# Patient Record
Sex: Female | Born: 1965 | Race: White | Hispanic: No | Marital: Married | Smoking: Never smoker
Health system: Southern US, Community
[De-identification: ages and names within clinical notes are randomized; demographics above are authoritative.]

## PROBLEM LIST (undated history)

## (undated) DIAGNOSIS — M810 Age-related osteoporosis without current pathological fracture: Secondary | ICD-10-CM

## (undated) DIAGNOSIS — J449 Chronic obstructive pulmonary disease, unspecified: Secondary | ICD-10-CM

## (undated) DIAGNOSIS — K219 Gastro-esophageal reflux disease without esophagitis: Secondary | ICD-10-CM

## (undated) DIAGNOSIS — E559 Vitamin D deficiency, unspecified: Secondary | ICD-10-CM

## (undated) DIAGNOSIS — E785 Hyperlipidemia, unspecified: Secondary | ICD-10-CM

## (undated) DIAGNOSIS — M549 Dorsalgia, unspecified: Secondary | ICD-10-CM

## (undated) DIAGNOSIS — F32A Depression, unspecified: Secondary | ICD-10-CM

## (undated) DIAGNOSIS — R011 Cardiac murmur, unspecified: Secondary | ICD-10-CM

## (undated) DIAGNOSIS — E039 Hypothyroidism, unspecified: Secondary | ICD-10-CM

## (undated) DIAGNOSIS — R413 Other amnesia: Secondary | ICD-10-CM

## (undated) DIAGNOSIS — S3992XA Unspecified injury of lower back, initial encounter: Secondary | ICD-10-CM

## (undated) DIAGNOSIS — G5600 Carpal tunnel syndrome, unspecified upper limb: Secondary | ICD-10-CM

## (undated) DIAGNOSIS — H9190 Unspecified hearing loss, unspecified ear: Secondary | ICD-10-CM

## (undated) DIAGNOSIS — I10 Essential (primary) hypertension: Secondary | ICD-10-CM

## (undated) DIAGNOSIS — I509 Heart failure, unspecified: Secondary | ICD-10-CM

## (undated) DIAGNOSIS — J9601 Acute respiratory failure with hypoxia: Secondary | ICD-10-CM

## (undated) DIAGNOSIS — O24429 Gestational diabetes mellitus in childbirth, unspecified control: Secondary | ICD-10-CM

## (undated) DIAGNOSIS — E669 Obesity, unspecified: Secondary | ICD-10-CM

## (undated) DIAGNOSIS — G43009 Migraine without aura, not intractable, without status migrainosus: Secondary | ICD-10-CM

## (undated) DIAGNOSIS — F329 Major depressive disorder, single episode, unspecified: Secondary | ICD-10-CM

## (undated) DIAGNOSIS — G4730 Sleep apnea, unspecified: Secondary | ICD-10-CM

## (undated) HISTORY — DX: Gestational diabetes mellitus in childbirth, unspecified control: O24.429

## (undated) HISTORY — DX: Other amnesia: R41.3

## (undated) HISTORY — DX: Hypothyroidism, unspecified: E03.9

## (undated) HISTORY — DX: Dorsalgia, unspecified: M54.9

## (undated) HISTORY — DX: Migraine without aura, not intractable, without status migrainosus: G43.009

## (undated) HISTORY — DX: Age-related osteoporosis without current pathological fracture: M81.0

## (undated) HISTORY — DX: Acute respiratory failure with hypoxia: J96.01

## (undated) HISTORY — DX: Vitamin D deficiency, unspecified: E55.9

## (undated) HISTORY — DX: Chronic obstructive pulmonary disease, unspecified: J44.9

## (undated) HISTORY — DX: Gastro-esophageal reflux disease without esophagitis: K21.9

## (undated) HISTORY — DX: Heart failure, unspecified: I50.9

## (undated) HISTORY — DX: Sleep apnea, unspecified: G47.30

## (undated) HISTORY — DX: Unspecified hearing loss, unspecified ear: H91.90

## (undated) HISTORY — PX: APPENDECTOMY: SHX54

## (undated) HISTORY — DX: Hyperlipidemia, unspecified: E78.5

## (undated) HISTORY — DX: Cardiac murmur, unspecified: R01.1

## (undated) HISTORY — DX: Obesity, unspecified: E66.9

## (undated) HISTORY — DX: Carpal tunnel syndrome, unspecified upper limb: G56.00

---
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
## 2004-07-19 ENCOUNTER — Ambulatory Visit: Payer: Self-pay | Admitting: Ophthalmology

## 2004-08-31 ENCOUNTER — Observation Stay: Payer: Self-pay

## 2004-08-31 ENCOUNTER — Other Ambulatory Visit: Payer: Self-pay

## 2004-09-05 ENCOUNTER — Ambulatory Visit: Payer: Self-pay | Admitting: Internal Medicine

## 2005-01-15 ENCOUNTER — Ambulatory Visit: Payer: Self-pay | Admitting: Family Medicine

## 2005-03-27 ENCOUNTER — Ambulatory Visit: Payer: Self-pay

## 2005-06-19 ENCOUNTER — Ambulatory Visit: Payer: Self-pay | Admitting: Nurse Practitioner

## 2005-07-01 ENCOUNTER — Emergency Department: Payer: Self-pay | Admitting: Emergency Medicine

## 2005-08-21 ENCOUNTER — Ambulatory Visit: Payer: Self-pay | Admitting: Gastroenterology

## 2005-11-25 ENCOUNTER — Ambulatory Visit: Payer: Self-pay | Admitting: Ophthalmology

## 2005-12-26 ENCOUNTER — Ambulatory Visit (HOSPITAL_COMMUNITY): Admission: RE | Admit: 2005-12-26 | Discharge: 2005-12-26 | Payer: Self-pay | Admitting: *Deleted

## 2007-01-23 IMAGING — CT CT ABD-PELV W/O CM
1 of 2 series · 16 of 32 positions shown, 20 images · non-contrast
Comparison: none

REASON FOR EXAM: Abdominal pain.  [facility] protocol. Rm 1
COMMENTS:

[Series 2: soft tissue · axial · 0.73mm/px · z∈[+461,+860]mm · 16 of 145 slices shown, 20 images]
[im 6/145  soft-tissue]
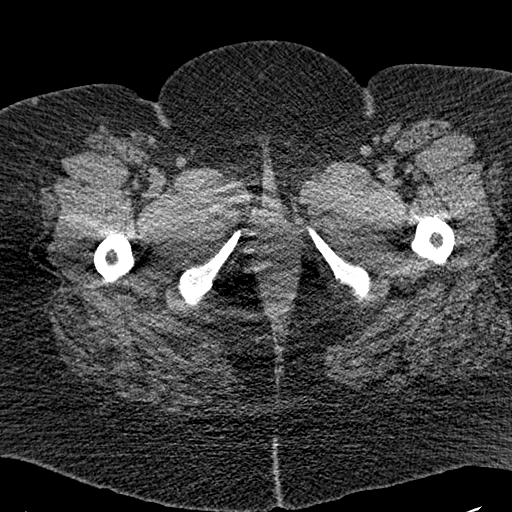
[im 6/145  bone]
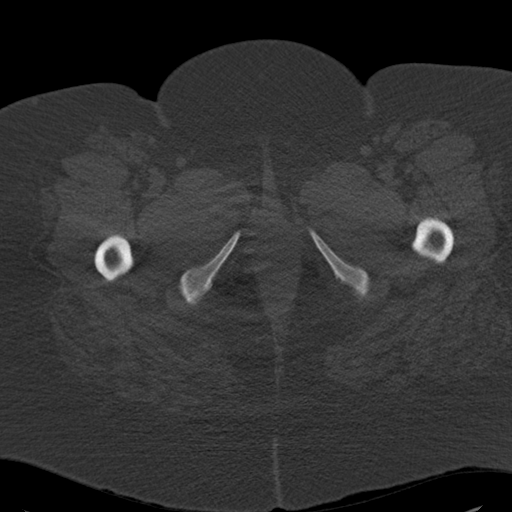
[im 16/145  soft-tissue]
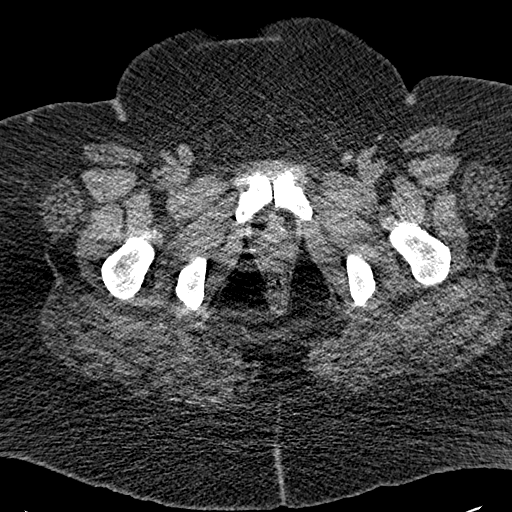
[im 26/145  soft-tissue]
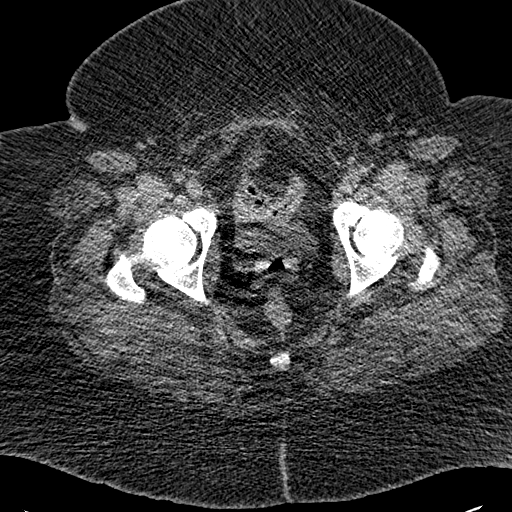
[im 37/145  soft-tissue]
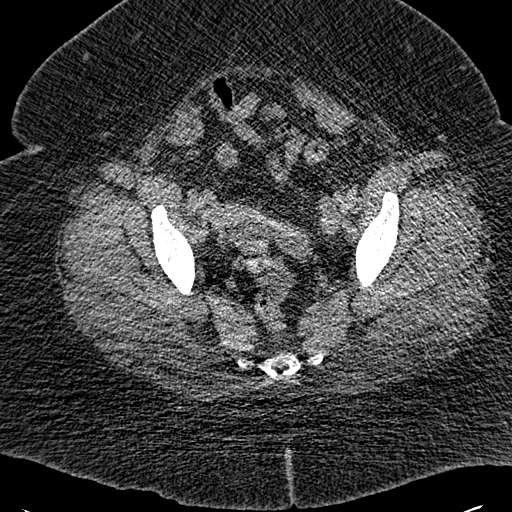
[im 47/145  soft-tissue]
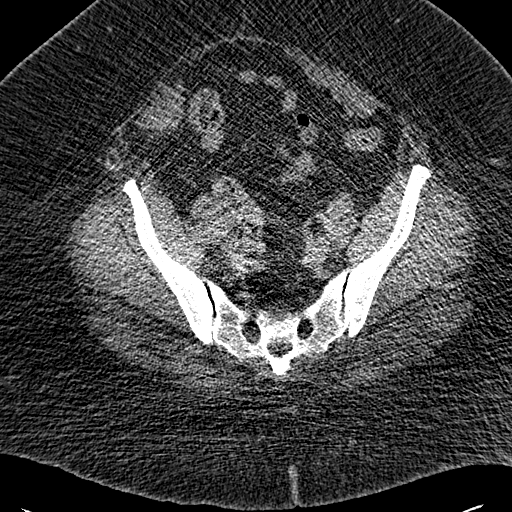
[im 57/145  soft-tissue]
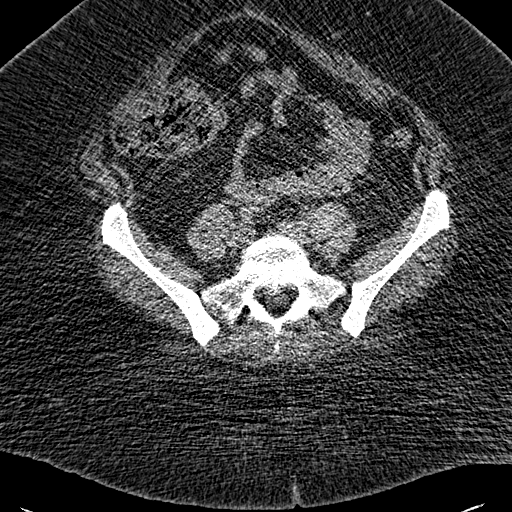
[im 67/145  soft-tissue]
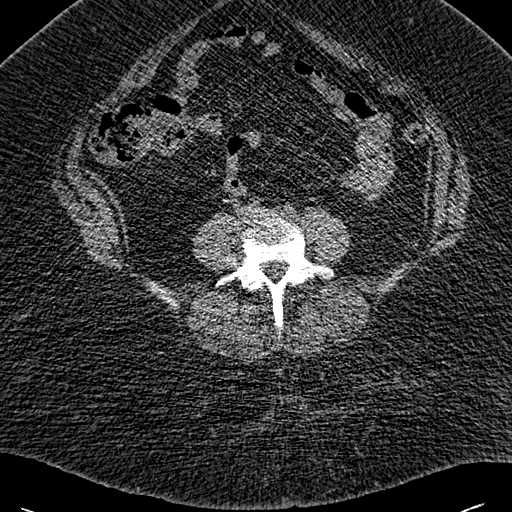
[im 78/145  soft-tissue]
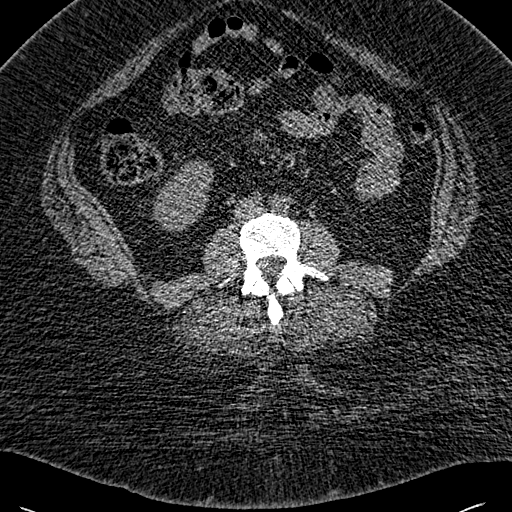
[im 88/145  soft-tissue]
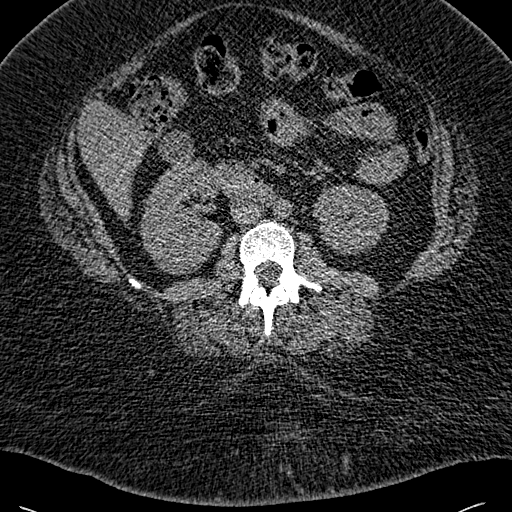
[im 88/145  bone]
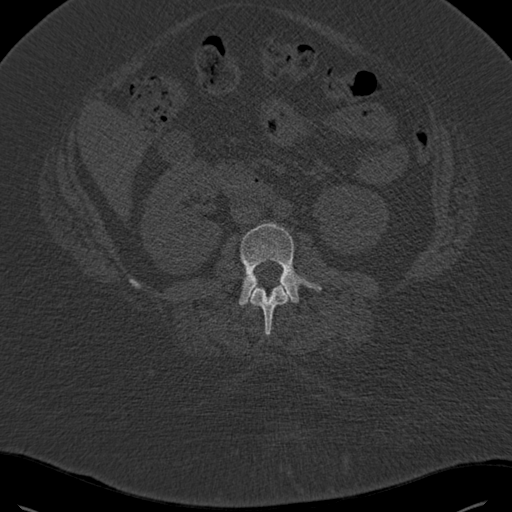
[im 98/145  soft-tissue]
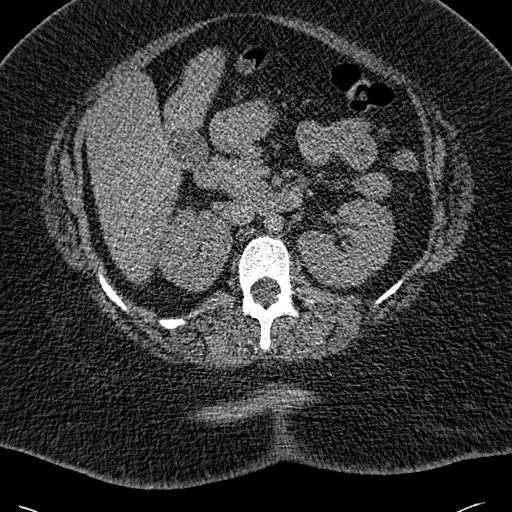
[im 109/145  soft-tissue]
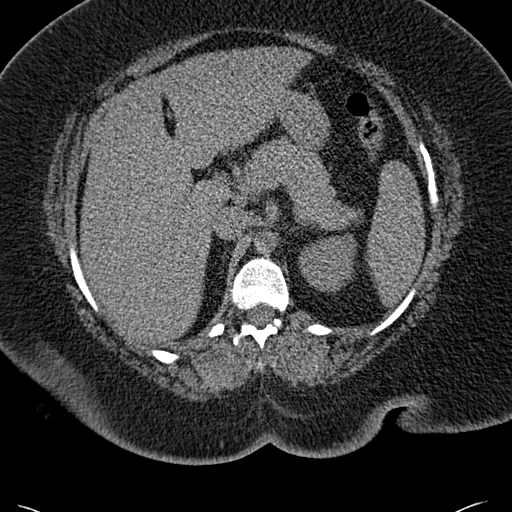
[im 119/145  soft-tissue]
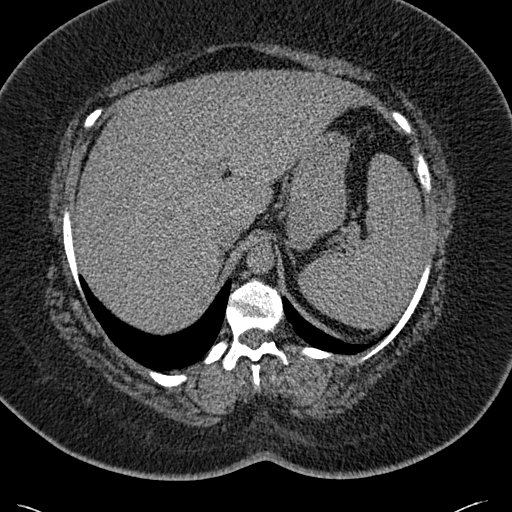
[im 124/145  lung]
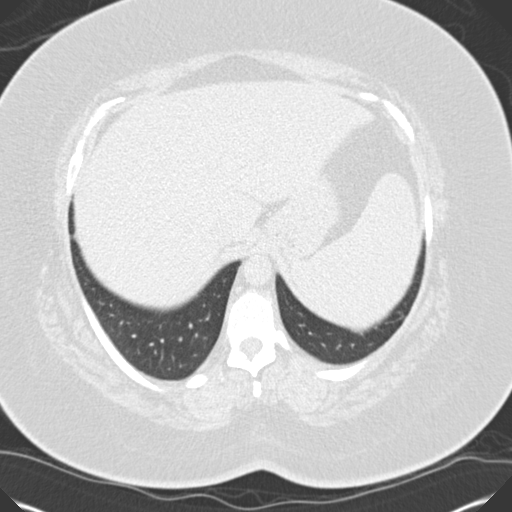
[im 129/145  soft-tissue]
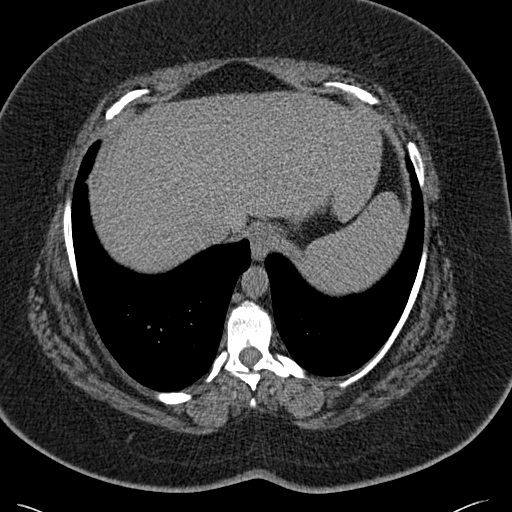
[im 129/145  lung]
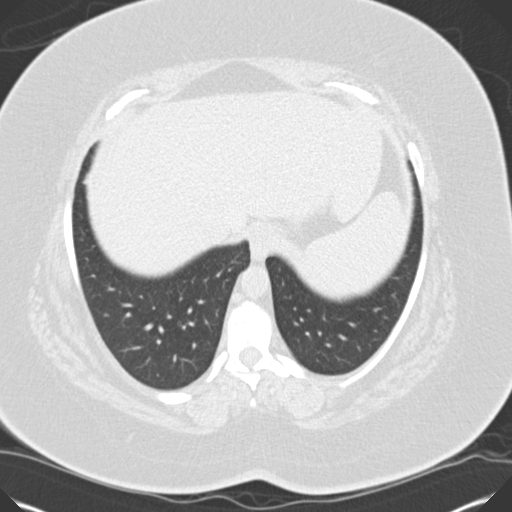
[im 134/145  lung]
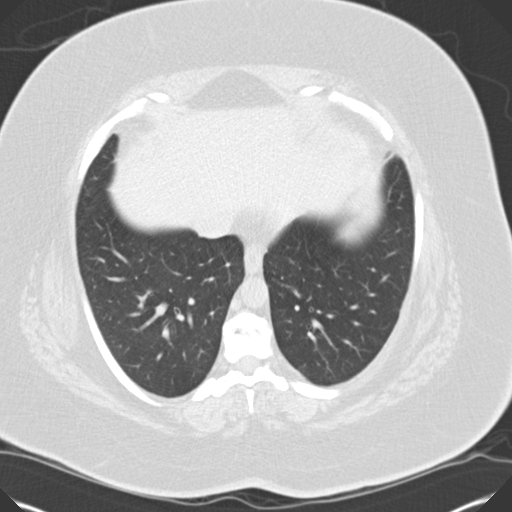
[im 139/145  soft-tissue]
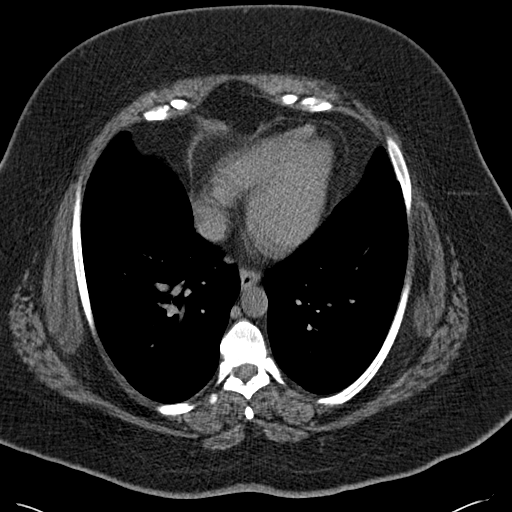
[im 139/145  lung]
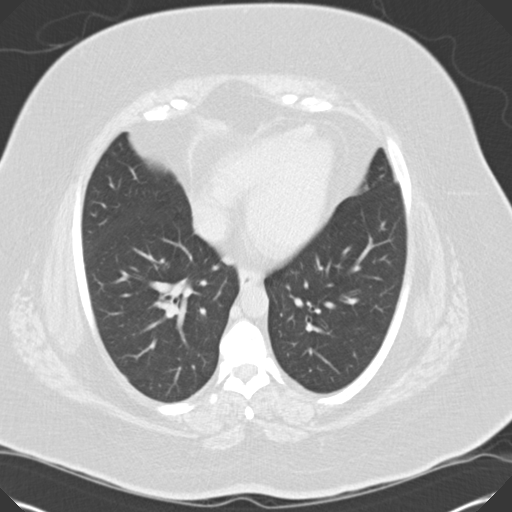

[16 of 32 positions shown; findings below may reference images not displayed]

PROCEDURE:     CT  - CT ABDOMEN AND PELVIS W[DATE]  [DATE]

RESULT:     Noncontrast emergent CT scan of the abdomen and pelvis is
performed.  The study shows no definite urinary tract stones.  The study is
very limited given the significant artifact because of the patient's body
habitus. No definite stone is identified and again no definite obstruction
is seen.  Certainly small stones could be obscured because of the artifact
present.  The adrenal glands and other solid abdominal viscera appear to be
grossly normal.  No radiopaque gallstones are seen.  The bladder is
nondistended.
IMPRESSION: Limited study because of the patient's body habitus causing some fairly
significant artifact.

No definite stones or obstructive change is seen.

## 2007-01-27 ENCOUNTER — Ambulatory Visit: Payer: Self-pay

## 2007-02-01 ENCOUNTER — Other Ambulatory Visit: Payer: Self-pay

## 2007-02-01 ENCOUNTER — Emergency Department: Payer: Self-pay | Admitting: Emergency Medicine

## 2008-02-16 ENCOUNTER — Ambulatory Visit: Payer: Self-pay

## 2009-03-21 ENCOUNTER — Ambulatory Visit: Payer: Self-pay | Admitting: Family Medicine

## 2009-10-26 ENCOUNTER — Emergency Department: Payer: Self-pay | Admitting: Unknown Physician Specialty

## 2010-05-08 ENCOUNTER — Ambulatory Visit: Payer: Self-pay

## 2010-05-13 ENCOUNTER — Ambulatory Visit: Payer: Self-pay

## 2011-06-10 ENCOUNTER — Ambulatory Visit: Payer: Self-pay | Admitting: Family Medicine

## 2012-07-27 ENCOUNTER — Ambulatory Visit: Payer: Self-pay

## 2012-08-11 ENCOUNTER — Ambulatory Visit: Payer: Self-pay

## 2013-09-26 ENCOUNTER — Ambulatory Visit: Payer: Self-pay

## 2018-09-14 ENCOUNTER — Other Ambulatory Visit: Payer: Self-pay

## 2018-09-14 ENCOUNTER — Emergency Department: Payer: Medicaid Other

## 2018-09-14 ENCOUNTER — Inpatient Hospital Stay
Admission: EM | Admit: 2018-09-14 | Discharge: 2018-09-16 | DRG: 189 | Disposition: A | Discharge status: Home Health | Payer: Medicaid Other | Attending: Internal Medicine | Admitting: Internal Medicine

## 2018-09-14 DIAGNOSIS — E875 Hyperkalemia: Secondary | ICD-10-CM | POA: Diagnosis present

## 2018-09-14 DIAGNOSIS — E860 Dehydration: Secondary | ICD-10-CM | POA: Diagnosis present

## 2018-09-14 DIAGNOSIS — E662 Morbid (severe) obesity with alveolar hypoventilation: Secondary | ICD-10-CM | POA: Diagnosis present

## 2018-09-14 DIAGNOSIS — Z8701 Personal history of pneumonia (recurrent): Secondary | ICD-10-CM | POA: Diagnosis not present

## 2018-09-14 DIAGNOSIS — J9601 Acute respiratory failure with hypoxia: Secondary | ICD-10-CM | POA: Diagnosis present

## 2018-09-14 DIAGNOSIS — J9621 Acute and chronic respiratory failure with hypoxia: Secondary | ICD-10-CM | POA: Diagnosis not present

## 2018-09-14 DIAGNOSIS — Z6841 Body Mass Index (BMI) 40.0 and over, adult: Secondary | ICD-10-CM | POA: Diagnosis not present

## 2018-09-14 DIAGNOSIS — Z20828 Contact with and (suspected) exposure to other viral communicable diseases: Secondary | ICD-10-CM | POA: Diagnosis present

## 2018-09-14 DIAGNOSIS — R0602 Shortness of breath: Secondary | ICD-10-CM

## 2018-09-14 DIAGNOSIS — E874 Mixed disorder of acid-base balance: Secondary | ICD-10-CM | POA: Diagnosis present

## 2018-09-14 DIAGNOSIS — I10 Essential (primary) hypertension: Secondary | ICD-10-CM | POA: Diagnosis present

## 2018-09-14 DIAGNOSIS — J9691 Respiratory failure, unspecified with hypoxia: Secondary | ICD-10-CM

## 2018-09-14 DIAGNOSIS — N179 Acute kidney failure, unspecified: Secondary | ICD-10-CM | POA: Diagnosis present

## 2018-09-14 HISTORY — DX: Unspecified injury of lower back, initial encounter: S39.92XA

## 2018-09-14 HISTORY — DX: Essential (primary) hypertension: I10

## 2018-09-14 HISTORY — DX: Depression, unspecified: F32.A

## 2018-09-14 LAB — CBC WITH DIFFERENTIAL/PLATELET
Abs Immature Granulocytes: 0.05 10*3/uL (ref 0.00–0.07)
Basophils Absolute: 0 10*3/uL (ref 0.0–0.1)
Basophils Relative: 0 %
Eosinophils Absolute: 0.3 10*3/uL (ref 0.0–0.5)
Eosinophils Relative: 3 %
HCT: 38.2 % (ref 36.0–46.0)
Hemoglobin: 11.2 g/dL — ABNORMAL LOW (ref 12.0–15.0)
Immature Granulocytes: 0 %
Lymphocytes Relative: 11 %
Lymphs Abs: 1.3 10*3/uL (ref 0.7–4.0)
MCH: 26.7 pg (ref 26.0–34.0)
MCHC: 29.3 g/dL — ABNORMAL LOW (ref 30.0–36.0)
MCV: 91 fL (ref 80.0–100.0)
Monocytes Absolute: 0.6 10*3/uL (ref 0.1–1.0)
Monocytes Relative: 5 %
Neutro Abs: 9.4 10*3/uL — ABNORMAL HIGH (ref 1.7–7.7)
Neutrophils Relative %: 81 %
Platelets: 340 10*3/uL (ref 150–400)
RBC: 4.2 MIL/uL (ref 3.87–5.11)
RDW: 14.7 % (ref 11.5–15.5)
WBC: 11.7 10*3/uL — ABNORMAL HIGH (ref 4.0–10.5)
nRBC: 0 % (ref 0.0–0.2)

## 2018-09-14 LAB — COMPREHENSIVE METABOLIC PANEL
ALT: 14 U/L (ref 0–44)
AST: 19 U/L (ref 15–41)
Albumin: 2.9 g/dL — ABNORMAL LOW (ref 3.5–5.0)
Alkaline Phosphatase: 72 U/L (ref 38–126)
Anion gap: 8 (ref 5–15)
BUN: 31 mg/dL — ABNORMAL HIGH (ref 6–20)
CO2: 33 mmol/L — ABNORMAL HIGH (ref 22–32)
Calcium: 8.6 mg/dL — ABNORMAL LOW (ref 8.9–10.3)
Chloride: 99 mmol/L (ref 98–111)
Creatinine, Ser: 1.07 mg/dL — ABNORMAL HIGH (ref 0.44–1.00)
GFR calc Af Amer: >60 mL/min (ref >60)
GFR calc non Af Amer: 60 mL/min — ABNORMAL LOW (ref >60)
Glucose, Bld: 120 mg/dL — ABNORMAL HIGH (ref 70–99)
Potassium: 5.3 mmol/L — ABNORMAL HIGH (ref 3.5–5.1)
Sodium: 140 mmol/L (ref 135–145)
Total Bilirubin: 0.6 mg/dL (ref 0.3–1.2)
Total Protein: 6.4 g/dL — ABNORMAL LOW (ref 6.5–8.1)

## 2018-09-14 LAB — TROPONIN I (HIGH SENSITIVITY)
Troponin I (High Sensitivity): 10 ng/L (ref <18)
Troponin I (High Sensitivity): 7 ng/L (ref <18)

## 2018-09-14 LAB — SARS CORONAVIRUS 2 BY RT PCR (HOSPITAL ORDER, PERFORMED IN ~~LOC~~ HOSPITAL LAB): SARS Coronavirus 2: NEGATIVE

## 2018-09-14 LAB — BRAIN NATRIURETIC PEPTIDE: B Natriuretic Peptide: 99 pg/mL (ref 0.0–100.0)

## 2018-09-14 LAB — MAGNESIUM
Magnesium: 1.9 mg/dL (ref 1.7–2.4)
Magnesium: 1.9 mg/dL (ref 1.7–2.4)

## 2018-09-14 MED ORDER — IOHEXOL 350 MG/ML SOLN
75.0000 mL | Freq: Once | INTRAVENOUS | Status: AC | PRN
  Administered 2018-09-14: 75 mL via INTRAVENOUS

## 2018-09-14 MED ORDER — ALBUTEROL SULFATE (2.5 MG/3ML) 0.083% IN NEBU
2.5000 mg | INHALATION_SOLUTION | Freq: Four times a day (QID) | RESPIRATORY_TRACT | Status: DC
  Administered 2018-09-14 – 2018-09-15 (×4): 2.5 mg via RESPIRATORY_TRACT
  Filled 2018-09-14 (×5): qty 3

## 2018-09-14 MED ORDER — SODIUM CHLORIDE 0.9% FLUSH
3.0000 mL | INTRAVENOUS | Status: DC | PRN
  Administered 2018-09-15: 21:00:00 3 mL via INTRAVENOUS
  Filled 2018-09-14: qty 3

## 2018-09-14 MED ORDER — GUAIFENESIN-DM 100-10 MG/5ML PO SYRP: 5.0000 mL | ORAL_SOLUTION | ORAL | Status: DC | PRN

## 2018-09-14 MED ORDER — ONDANSETRON HCL 4 MG/2ML IJ SOLN: 4.0000 mg | Freq: Four times a day (QID) | INTRAMUSCULAR | Status: DC | PRN

## 2018-09-14 MED ORDER — SODIUM CHLORIDE 0.9 % IV SOLN
INTRAVENOUS | Status: AC
  Administered 2018-09-14 – 2018-09-15 (×2): via INTRAVENOUS

## 2018-09-14 MED ORDER — ACETAMINOPHEN 650 MG RE SUPP: 650.0000 mg | Freq: Four times a day (QID) | RECTAL | Status: DC | PRN

## 2018-09-14 MED ORDER — ENOXAPARIN SODIUM 40 MG/0.4ML ~~LOC~~ SOLN
40.0000 mg | Freq: Two times a day (BID) | SUBCUTANEOUS | Status: DC
  Administered 2018-09-14 – 2018-09-15 (×2): 40 mg via SUBCUTANEOUS
  Filled 2018-09-14 (×2): qty 0.4

## 2018-09-14 MED ORDER — ACETAMINOPHEN 325 MG PO TABS: 650.0000 mg | ORAL_TABLET | Freq: Four times a day (QID) | ORAL | Status: DC | PRN

## 2018-09-14 MED ORDER — SODIUM CHLORIDE 0.9 % IV SOLN: 250.0000 mL | INTRAVENOUS | Status: DC | PRN

## 2018-09-14 MED ORDER — SENNOSIDES-DOCUSATE SODIUM 8.6-50 MG PO TABS: 1.0000 | ORAL_TABLET | Freq: Every evening | ORAL | Status: DC | PRN

## 2018-09-14 MED ORDER — ONDANSETRON HCL 4 MG PO TABS: 4.0000 mg | ORAL_TABLET | Freq: Four times a day (QID) | ORAL | Status: DC | PRN

## 2018-09-14 MED ORDER — SODIUM CHLORIDE 0.9% FLUSH: 3.0000 mL | Freq: Two times a day (BID) | INTRAVENOUS | Status: DC

## 2018-09-14 NOTE — ED Notes (Signed)
EDP at bedside  

## 2018-09-14 NOTE — ED Notes (Signed)
Patient transported to CT 

## 2018-09-14 NOTE — H&P (Signed)
[facility] [place] NAME: Kaitlyn Taylor    MR#:  [id number]  DATE OF BIRTH:  1965/11/30  DATE OF ADMISSION:  09/14/2018  PRIMARY CARE PHYSICIAN: [facility]   REQUESTING/REFERRING PHYSICIAN:   CHIEF COMPLAINT:   Chief Complaint  Patient presents with  . Shortness of Breath   Shortness of breath for 2 weeks. HISTORY OF PRESENT ILLNESS:  Kaitlyn Taylor  is a 53 y.o. female with a known history of pneumonia, hypertension, depression and back injury.  The patient has had worsening shortness of breath for the past 2 weeks.  She also complains of mild cough and orthopnea but no wheezing, chest pain, fever or chills.  No leg edema.  She is found hypoxia at 70s percent in the ED and put on oxygen by nasal cannula.  CT angios of the chest did not show any PE or pneumonia.  ED physician request admission. PAST MEDICAL HISTORY:   Past Medical History:  Diagnosis Date  . Back injury   . Depression   . Hypertension     PAST SURGICAL HISTORY:  History reviewed. No pertinent surgical history.  SOCIAL HISTORY:   Social History   Tobacco Use  . Smoking status: Never Smoker  . Smokeless tobacco: Never Used  Substance Use Topics  . Alcohol use: Not Currently    FAMILY HISTORY:  No family history on file.  DRUG ALLERGIES:   Allergies  Allergen Reactions  . Aspirin   . Rocephin [Ceftriaxone]     REVIEW OF SYSTEMS:   Review of Systems  Constitutional: Positive for malaise/fatigue and weight loss. Negative for chills and fever.  HENT: Negative for sore throat.   Eyes: Negative for blurred vision and double vision.  Respiratory: Positive for cough and shortness of breath. Negative for hemoptysis, sputum production, wheezing and stridor.   Cardiovascular: Negative for chest pain, palpitations, orthopnea and leg swelling.  Gastrointestinal: Negative for abdominal pain, blood in stool, diarrhea, melena, nausea  and vomiting.  Genitourinary: Negative for dysuria, flank pain and hematuria.  Musculoskeletal: Negative for back pain and joint pain.  Neurological: Negative for dizziness, sensory change, focal weakness, seizures, loss of consciousness, weakness and headaches.  Endo/Heme/Allergies: Negative for polydipsia.  Psychiatric/Behavioral: Negative for depression. The patient is not nervous/anxious.     MEDICATIONS AT HOME:   Prior to Admission medications   Not on File      VITAL SIGNS:  Blood pressure 129/70, pulse 89, temperature 98.1 F (36.7 C), temperature source Oral, resp. rate 16, height 4\' 10"  (1.473 m), weight (!) 138.3 kg, SpO2 91 %.  PHYSICAL EXAMINATION:  Physical Exam  GENERAL:  53 y.o.-year-old patient lying in the bed with no acute distress.  Morbid obesity. EYES: Pupils equal, round, reactive to light and accommodation. No scleral icterus. Extraocular muscles intact.  HEENT: Head atraumatic, normocephalic. Oropharynx and nasopharynx clear.  NECK:  Supple, no jugular venous distention. No thyroid enlargement, no tenderness.  LUNGS: Normal breath sounds bilaterally, no wheezing, rales,rhonchi or crepitation. No use of accessory muscles of respiration.  CARDIOVASCULAR: S1, S2 normal. No murmurs, rubs, or gallops.  ABDOMEN: Soft, nontender, nondistended. Bowel sounds present. No organomegaly or mass.  EXTREMITIES: No pedal edema, cyanosis, or clubbing.  NEUROLOGIC: Cranial nerves II through XII are intact. Muscle strength 5/5 in all extremities. Sensation intact. Gait not checked.  PSYCHIATRIC: The patient is alert and oriented x 3.  SKIN: No obvious rash, lesion, or ulcer.   LABORATORY  PANEL:   CBC Recent Labs  Lab 09/14/18 1418  WBC 11.7*  HGB 11.2*  HCT 38.2  PLT 340   ------------------------------------------------------------------------------------------------------------------  Chemistries  Recent Labs  Lab 09/14/18 1418  NA 140  K 5.3*  CL 99  CO2  33*  GLUCOSE 120*  BUN 31*  CREATININE 1.07*  CALCIUM 8.6*  MG 1.9  AST 19  ALT 14  ALKPHOS 72  BILITOT 0.6   ------------------------------------------------------------------------------------------------------------------  Cardiac Enzymes No results for input(s): TROPONINI in the last 168 hours. ------------------------------------------------------------------------------------------------------------------  RADIOLOGY:  Ct Angio Chest Pe W And/or Wo Contrast  Result Date: 09/14/2018 CLINICAL DATA:  Chest pain. EXAM: CT ANGIOGRAPHY CHEST WITH CONTRAST TECHNIQUE: Multidetector CT imaging of the chest was performed using the standard protocol during bolus administration of intravenous contrast. Multiplanar CT image reconstructions and MIPs were obtained to evaluate the vascular anatomy. CONTRAST:  75mL OMNIPAQUE IOHEXOL 350 MG/ML SOLN COMPARISON:  Radiograph of same day. FINDINGS: Cardiovascular: Satisfactory opacification of the pulmonary arteries to the segmental level. No evidence of pulmonary embolism. Mild cardiomegaly is noted. No pericardial effusion. Mediastinum/Nodes: No enlarged mediastinal, hilar, or axillary lymph nodes. Thyroid gland, trachea, and esophagus demonstrate no significant findings. Lungs/Pleura: Lungs are clear. No pleural effusion or pneumothorax. Upper Abdomen: No acute abnormality. Musculoskeletal: No chest wall abnormality. No acute or significant osseous findings. Review of the MIP images confirms the above findings. IMPRESSION: No definite evidence of pulmonary embolus. No acute abnormality seen in the chest. Electronically Signed   By: Lupita RaiderJames  Green Jr M.D.   On: 09/14/2018 15:48   Dg Chest Portable 1 View  Result Date: 09/14/2018 CLINICAL DATA:  Shortness of breath and chest pain for 3 days. EXAM: PORTABLE CHEST 1 VIEW COMPARISON:  08/31/2004 FINDINGS: Mediastinal contours appear intact. There is no evidence of focal airspace consolidation, pleural effusion or  pneumothorax. Osseous structures are without acute abnormality. Soft tissues are grossly normal. IMPRESSION: No active disease. Electronically Signed   By: Ted Mcalpineobrinka  Dimitrova M.D.   On: 09/14/2018 14:43      IMPRESSION AND PLAN:   Acute respiratory failure with hypoxia.  Unclear etiology.  The patient will be admitted to medical floor. Continue oxygen by nasal cannula, albuterol puffs as needed.  Echocardiograph.  Pulmonary consult for possible OSA and pulmonary hypertension. Hyperkalemia.  IV fluid support and follow-up potassium. Dehydration, IV fluid support. Morbid obesity, possible OSA.  Diet control and follow-up PCP as outpatient. All the records are reviewed and case discussed with ED provider. Management plans discussed with the patient, family and they are in agreement.  CODE STATUS: Full code.  TOTAL TIME TAKING CARE OF THIS PATIENT: 42 minutes.    Shaune PollackQing Zebulen Simonis M.D on 09/14/2018 at 4:52 PM  Between 7am to 6pm - Pager - [phone]  After 6pm go to [email or web address] - Scientist, research (life sciences)password [email or web address]  [facility]  Office  [phone]  CC: Primary care physician; [facility]   Note: This dictation was prepared with Dragon dictation along with smaller phrase technology. Any transcriptional errors that result from this process are unin

## 2018-09-14 NOTE — ED Provider Notes (Signed)
-----------------------------------------   5:39 PM on 09/14/2018 -----------------------------------------  I took over care on this patient from Dr. Jari Pigg.  Patient was pending CT chest rule out PE.  CT is negative, however given the patient's hypoxia off of oxygen she will need admission for further work-up.  I signed her out to the hospitalist Dr. Bridgett Larsson.   Arta Silence, MD 09/14/18 [phone]

## 2018-09-14 NOTE — ED Provider Notes (Signed)
[facility] Emergency Department Provider Note  ____________________________________________   First MD Initiated Contact with Patient 09/14/18 1405     (approximate)  I have reviewed the triage vital signs and the nursing notes.   HISTORY  Chief Complaint Shortness of Breath    HPI Kaitlyn Taylor is a 53 y.o. female with morbid obesity who presents for shortness of breath.  Patient said that she recently was diagnosed with the flu in March.  After having the flu she then developed pneumonia.  Patient required admission for a few days.  Patient says that she originally started feeling better from this.  However the past 2 weeks she has had increasing shortness of breath that is now become more severe, worse with exertion, constant, nothing makes it better.  Has a mild associated chest pain with it as well.  Denies a history of smoking or COPD.  She thought that she may have had asthma and has had tried some albuterol without improvement.  She denies any urinary symptoms.  Denies abdominal pain.  She is immobile most of the day.  But denies a history of PE.       Past Medical History:  Diagnosis Date   Back injury    Depression    Hypertension     There are no active problems to display for this patient.   History reviewed. No pertinent surgical history.  Prior to Admission medications   Not on File    Allergies Aspirin and Rocephin [ceftriaxone]  No family history on file.  Social History Social History   Tobacco Use   Smoking status: Never Smoker   Smokeless tobacco: Never Used  Substance Use Topics   Alcohol use: Not Currently   Drug use: Never    Review of Systems Constitutional: No fever/chills Eyes: No visual changes. ENT: No sore throat. Cardiovascular: + chest pain  Respiratory: + SOB Gastrointestinal: No abdominal pain.  No nausea, no vomiting.  No diarrhea.  No constipation. Genitourinary: Negative for  dysuria. Musculoskeletal: Negative for back pain. Skin: Negative for rash. Neurological: Negative for headaches, focal weakness or numbness. All other ROS negative ____________________________________________   PHYSICAL EXAM: Blood pressure 129/70, pulse 89, temperature 98.1 F (36.7 C), temperature source Oral, resp. rate 16, height 4\' 10"  (1.473 m), weight (!) 138.3 kg, SpO2 91 %.  Constitutional: Alert and oriented. Well appearing and in no acute distress.  Obese female Eyes: Conjunctivae are normal. EOMI. Head: Atraumatic. Nose: No congestion/rhinnorhea. Mouth/Throat: Mucous membranes are moist.   Neck: No stridor. Trachea Midline. FROM Cardiovascular: Normal rate, regular rhythm. Grossly normal heart sounds.  Good peripheral circulation. Respiratory: Poor inspiratory effort, no wheezing  gastrointestinal: Soft and nontender. No distention. No abdominal bruits.  Musculoskeletal: No lower extremity tenderness nor edema.  No joint effusions. Neurologic:  Normal speech and language. No gross focal neurologic deficits are appreciated.  Skin:  Skin is warm, dry and intact. No rash noted. Psychiatric: Mood and affect are normal. Speech and behavior are normal. GU: Deferred   ____________________________________________   LABS (all labs ordered are listed, but only abnormal results are displayed)  Labs Reviewed  CBC WITH DIFFERENTIAL/PLATELET - Abnormal; Notable for the following components:      Result Value   WBC 11.7 (*)    Hemoglobin 11.2 (*)    MCHC 29.3 (*)    Neutro Abs 9.4 (*)    All other components within normal limits  COMPREHENSIVE METABOLIC PANEL - Abnormal; Notable for the following  components:   Potassium 5.3 (*)    CO2 33 (*)    Glucose, Bld 120 (*)    BUN 31 (*)    Creatinine, Ser 1.07 (*)    Calcium 8.6 (*)    Total Protein 6.4 (*)    Albumin 2.9 (*)    GFR calc non Af Amer 60 (*)    All other components within normal limits  SARS CORONAVIRUS 2  (HOSPITAL ORDER, [facility])  TROPONIN I (HIGH SENSITIVITY)  BRAIN NATRIURETIC PEPTIDE  MAGNESIUM  TROPONIN I (HIGH SENSITIVITY)   ____________________________________________   ED ECG REPORT I, Kaitlyn Taylor, the attending physician, personally viewed and interpreted this ECG.  EKG sinus rate of 92, normal axis, no T wave inversion, no ST elevation ____________________________________________  RADIOLOGY Kaitlyn Taylor, personally viewed and evaluated these images (plain radiographs) as part of my medical decision making, as well as reviewing the written report by the radiologist.  ED MD interpretation: Normal chest x-ray  Official radiology report(s): Dg Chest Portable 1 View  Result Date: 09/14/2018 CLINICAL DATA:  Shortness of breath and chest pain for 3 days. EXAM: PORTABLE CHEST 1 VIEW COMPARISON:  08/31/2004 FINDINGS: Mediastinal contours appear intact. There is no evidence of focal airspace consolidation, pleural effusion or pneumothorax. Osseous structures are without acute abnormality. Soft tissues are grossly normal. IMPRESSION: No active disease. Electronically Signed   By: Kaitlyn Taylor M.D.   On: 09/14/2018 14:43    ____________________________________________   PROCEDURES  Procedure(s) performed (including Critical Care):  Procedures   ____________________________________________   INITIAL IMPRESSION / ASSESSMENT AND PLAN / ED COURSE   Kaitlyn Taylor was evaluated in Emergency Department on 09/14/2018 for the symptoms described in the history of present illness. She was evaluated in the context of the global COVID-19 pandemic, which necessitated consideration that the patient might be at risk for infection with the SARS-CoV-2 virus that causes COVID-19. Institutional protocols and algorithms that pertain to the evaluation of patients at risk for COVID-19 are in a state of rapid change based on information released by regulatory  bodies including the CDC and federal and state organizations. These policies and algorithms were followed during the patient's care in the ED.    53 year old with recent flu and pneumonia who now presents with new shortness of breath over the past 2 weeks.  Will get chest x-ray to evaluate for pneumonia.  If imaging is negative will get CT PE given patient's immobility to evaluate for pulmonary embolism.  Cardiac markers ordered to evaluate for ACS but lower suspicion for this.  Also get COVID testing to rule this out.   Troponin was 10 and given this started greater than 2 weeks ago I very low suspicion for ACS.  Creatinine slightly elevated at 1.07/ k 5.3   Clinical Course as of Sep 13 1541  Tue Sep 14, 2018  1459 Chest x-ray is negative.  Given patient satted 79% on room air and had to be placed on 2 L will get a CT PE.  Anticipate patient will need to be admitted for her hypoxia.   [MF]    Clinical Course User Index [MF] Kaitlyn Williams, MD    Patient handed off to the oncoming team pending CT scan.  Anticipate patient will need to be admitted for her hypoxia.    ____________________________________________   FINAL CLINICAL IMPRESSION(S) / ED DIAGNOSES   Final diagnoses:  None      MEDICATIONS GIVEN DURING  THIS VISIT:  Medications  iohexol (OMNIPAQUE) 350 MG/ML injection 75 mL (75 mLs Intravenous Contrast Given 09/14/18 1521)     ED Discharge Orders    None       Note:  This document was prepared using Dragon voice recognition software and may include unintentional dictation errors.   Concha SeFunke, Reannah Totten E, MD 09/14/18 1550

## 2018-09-14 NOTE — ED Notes (Signed)
Pt given phone to call husband 

## 2018-09-14 NOTE — ED Notes (Signed)
Pt placed on 2 L [place] for desatting on room air to low 80's. Came up to low 90's. Will continue to monitor.

## 2018-09-14 NOTE — ED Notes (Signed)
Pt given a phone to call husband back

## 2018-09-14 NOTE — ED Triage Notes (Signed)
Pt arrives via EMS from home after having shortness of breath and chest pain x3 days with difficulty breathing and a cough- 108 CBG per EMS

## 2018-09-14 NOTE — ED Notes (Signed)
ED TO INPATIENT HANDOFF REPORT  ED Nurse Name and Phone #: Hassaan Crite [phone]  S Name/Age/Gender Kaitlyn Taylor 53 y.o. female Room/Bed: ED07A/ED07A  Code Status   Code Status: Not on file  Home/SNF/Other Home Patient oriented to: self, place, time and situation Is this baseline? Yes   Triage Complete: Triage complete  Chief Complaint sob  Triage Note Pt arrives via EMS from home after having shortness of breath and chest pain x3 days with difficulty breathing and a cough- 108 CBG per EMS   Allergies Allergies  Allergen Reactions  . Aspirin   . Rocephin [Ceftriaxone]     Level of Care/Admitting Diagnosis ED Disposition    ED Disposition Condition Comment   Admit  Hospital Area: [facility] [[id number]]  Level of Care: Med-Surg [16]  Covid Evaluation: Confirmed COVID Negative  Diagnosis: Acute respiratory failure with hypoxia [facility](HCC) [[id number]][[id number]]  Admitting Physician: Shaune PollackHEN, QING [[id number]]  Attending Physician: Shaune PollackCHEN, QING [[id number]]  Estimated length of stay: past midnight tomorrow  Certification:: I certify this patient will need inpatient services for at least 2 midnights  PT Class (Do Not Modify): Inpatient [101]  PT Acc Code (Do Not Modify): Private [1]       B Medical/Surgery History Past Medical History:  Diagnosis Date  . Back injury   . Depression   . Hypertension    History reviewed. No pertinent surgical history.   A IV Location/Drains/Wounds Patient Lines/Drains/Airways Status   Active Line/Drains/Airways    Name:   Placement date:   Placement time:   Site:   Days:   Peripheral IV 09/14/18 Left Other (Comment)   09/14/18    1418    Other (Comment)   less than 1          Intake/Output Last 24 hours No intake or output data in the 24 hours ending 09/14/18 1714  Labs/Imaging Results for orders placed or performed during the hospital encounter of 09/14/18 (from the past 48 hour(s))  CBC with Differential     Status: Abnormal   Collection Time: 09/14/18  2:18 PM  Result Value Ref Range   WBC 11.7 (H) 4.0 - 10.5 K/uL   RBC 4.20 3.87 - 5.11 MIL/uL   Hemoglobin 11.2 (L) 12.0 - 15.0 g/dL   HCT 08.638.2 57.836.0 - 46.946.0 %   MCV 91.0 80.0 - 100.0 fL   MCH 26.7 26.0 - 34.0 pg   MCHC 29.3 (L) 30.0 - 36.0 g/dL   RDW 62.914.7 52.811.5 - 41.315.5 %   Platelets 340 150 - 400 K/uL   nRBC 0.0 0.0 - 0.2 %   Neutrophils Relative % 81 %   Neutro Abs 9.4 (H) 1.7 - 7.7 K/uL   Lymphocytes Relative 11 %   Lymphs Abs 1.3 0.7 - 4.0 K/uL   Monocytes Relative 5 %   Monocytes Absolute 0.6 0.1 - 1.0 K/uL   Eosinophils Relative 3 %   Eosinophils Absolute 0.3 0.0 - 0.5 K/uL   Basophils Relative 0 %   Basophils Absolute 0.0 0.0 - 0.1 K/uL   Immature Granulocytes 0 %   Abs Immature Granulocytes 0.05 0.00 - 0.07 K/uL    Comment: Performed at [facility], [address] [place], [place] [address]  Troponin I (High Sensitivity)     Status: None   Collection Time: 09/14/18  2:18 PM  Result Value Ref Range   Troponin I (High Sensitivity) 10 <18 ng/L    Comment: (NOTE) Elevated high sensitivity troponin I (hsTnI) values  and significant  changes across serial measurements may suggest ACS but many other  chronic and acute conditions are known to elevate hsTnI results.  Refer to the "Links" section for chest pain algorithms and additional  guidance. Performed at [facility], [address] [place], [place] [address]   Brain natriuretic peptide     Status: None   Collection Time: 09/14/18  2:18 PM  Result Value Ref Range   B Natriuretic Peptide 99.0 0.0 - 100.0 pg/mL    Comment: Performed at [facility], [address] [place], [place] [address]  Magnesium     Status: None   Collection Time: 09/14/18  2:18 PM  Result Value Ref Range   Magnesium 1.9 1.7 - 2.4 mg/dL    Comment: Performed at [facility], [address] [place], [place] [address]  Comprehensive metabolic panel     Status: Abnormal    Collection Time: 09/14/18  2:18 PM  Result Value Ref Range   Sodium 140 135 - 145 mmol/L   Potassium 5.3 (H) 3.5 - 5.1 mmol/L    Comment: HEMOLYSIS AT THIS LEVEL MAY AFFECT RESULT   Chloride 99 98 - 111 mmol/L   CO2 33 (H) 22 - 32 mmol/L   Glucose, Bld 120 (H) 70 - 99 mg/dL   BUN 31 (H) 6 - 20 mg/dL   Creatinine, Ser 1.07 (H) 0.44 - 1.00 mg/dL   Calcium 8.6 (L) 8.9 - 10.3 mg/dL   Total Protein 6.4 (L) 6.5 - 8.1 g/dL   Albumin 2.9 (L) 3.5 - 5.0 g/dL   AST 19 15 - 41 U/L    Comment: HEMOLYSIS AT THIS LEVEL MAY AFFECT RESULT   ALT 14 0 - 44 U/L   Alkaline Phosphatase 72 38 - 126 U/L   Total Bilirubin 0.6 0.3 - 1.2 mg/dL    Comment: HEMOLYSIS AT THIS LEVEL MAY AFFECT RESULT   GFR calc non Af Amer 60 (L) >60 mL/min   GFR calc Af Amer >60 >60 mL/min   Anion gap 8 5 - 15    Comment: Performed at [facility], [address] [place], [place] [address]  SARS Coronavirus 2 (CEPHEID - Performed in [place] hospital lab), Hosp Order     Status: None   Collection Time: 09/14/18  2:19 PM   Specimen: Nasopharyngeal Swab  Result Value Ref Range   SARS Coronavirus 2 NEGATIVE NEGATIVE    Comment: (NOTE) If result is NEGATIVE SARS-CoV-2 target nucleic acids are NOT DETECTED. The SARS-CoV-2 RNA is generally detectable in upper and lower  respiratory specimens during the acute phase of infection. The lowest  concentration of SARS-CoV-2 viral copies this assay can detect is 250  copies / mL. A negative result does not preclude SARS-CoV-2 infection  and should not be used as the sole basis for treatment or other  patient management decisions.  A negative result may occur with  improper specimen collection / handling, submission of specimen other  than nasopharyngeal swab, presence of viral mutation(s) within the  areas targeted by this assay, and inadequate number of viral copies  (<250 copies / mL). A negative result must be combined with clinical  observations, patient history, and  epidemiological information. If result is POSITIVE SARS-CoV-2 target nucleic acids are DETECTED. The SARS-CoV-2 RNA is generally detectable in upper and lower  respiratory specimens dur ing the acute phase of infection.  Positive  results are indicative of active infection with SARS-CoV-2.  Clinical  correlation  with patient history and other diagnostic information is  necessary to determine patient infection status.  Positive results do  not rule out bacterial infection or co-infection with other viruses. If result is PRESUMPTIVE POSTIVE SARS-CoV-2 nucleic acids MAY BE PRESENT.   A presumptive positive result was obtained on the submitted specimen  and confirmed on repeat testing.  While 2019 novel coronavirus  (SARS-CoV-2) nucleic acids may be present in the submitted sample  additional confirmatory testing may be necessary for epidemiological  and / or clinical management purposes  to differentiate between  SARS-CoV-2 and other Sarbecovirus currently known to infect humans.  If clinically indicated additional testing with an alternate test  methodology [phone]([id number]) is advised. The SARS-CoV-2 RNA is generally  detectable in upper and lower respiratory sp ecimens during the acute  phase of infection. The expected result is Negative. Fact Sheet for Patients:  BoilerBrush.com.cyhttps://www.fda.gov/media/136312/download Fact Sheet for Healthcare Providers: https://pope.com/https://www.fda.gov/media/136313/download This test is not yet approved or cleared by the [place] FDA and has been authorized for detection and/or diagnosis of SARS-CoV-2 by FDA under an Emergency Use Authorization (EUA).  This EUA will remain in effect (meaning this test can be used) for the duration of the COVID-19 declaration under Section 564(b)(1) of the Act, 21 U.S.C. section 360bbb-3(b)(1), unless the authorization is terminated or revoked sooner. Performed at [facility], [address] [place], [place] [address]    Ct  Angio Chest Pe W And/or Wo Contrast  Result Date: 09/14/2018 CLINICAL DATA:  Chest pain. EXAM: CT ANGIOGRAPHY CHEST WITH CONTRAST TECHNIQUE: Multidetector CT imaging of the chest was performed using the standard protocol during bolus administration of intravenous contrast. Multiplanar CT image reconstructions and MIPs were obtained to evaluate the vascular anatomy. CONTRAST:  75mL OMNIPAQUE IOHEXOL 350 MG/ML SOLN COMPARISON:  Radiograph of same day. FINDINGS: Cardiovascular: Satisfactory opacification of the pulmonary arteries to the segmental level. No evidence of pulmonary embolism. Mild cardiomegaly is noted. No pericardial effusion. Mediastinum/Nodes: No enlarged mediastinal, hilar, or axillary lymph nodes. Thyroid gland, trachea, and esophagus demonstrate no significant findings. Lungs/Pleura: Lungs are clear. No pleural effusion or pneumothorax. Upper Abdomen: No acute abnormality. Musculoskeletal: No chest wall abnormality. No acute or significant osseous findings. Review of the MIP images confirms the above findings. IMPRESSION: No definite evidence of pulmonary embolus. No acute abnormality seen in the chest. Electronically Signed   By: Lupita RaiderJames  Green Jr M.D.   On: 09/14/2018 15:48   Dg Chest Portable 1 View  Result Date: 09/14/2018 CLINICAL DATA:  Shortness of breath and chest pain for 3 days. EXAM: PORTABLE CHEST 1 VIEW COMPARISON:  08/31/2004 FINDINGS: Mediastinal contours appear intact. There is no evidence of focal airspace consolidation, pleural effusion or pneumothorax. Osseous structures are without acute abnormality. Soft tissues are grossly normal. IMPRESSION: No active disease. Electronically Signed   By: Ted Mcalpineobrinka  Dimitrova M.D.   On: 09/14/2018 14:43    Pending Labs Unresulted Labs (From admission, onward)    Start     Ordered   09/14/18 1418  Troponin I (High Sensitivity)  STAT Now then every 2 hours,   STAT    Question:  Indication  Answer:  Suspect ACS   09/14/18 1418   Signed and  Held  HIV antibody (Routine Testing)  Once,   R     Signed and Held   Signed and Held  Basic metabolic panel  Tomorrow morning,   R     Signed and Held   Signed and Held  CBC  Tomorrow morning,   R     Signed and Held   Signed and Held  Creatinine, serum  (enoxaparin (LOVENOX)    CrCl >/= 30 ml/min)  Weekly,   R    Comments: while on enoxaparin therapy    Signed and Held   Signed and Held  Magnesium  Add-on,   R     Signed and Held   Signed and Held  Hemoglobin A1c  Add-on,   R     Signed and Held          Vitals/Pain Today's Vitals   09/14/18 1410 09/14/18 1416 09/14/18 1430 09/14/18 1431  BP:   129/70   Pulse:  90 90 89  Resp:  (!) 24 (!) 24 16  Temp:  98.1 F (36.7 C)    TempSrc:  Oral    SpO2:  93% (!) 79% 91%  Weight: (!) 138.3 kg     Height:      PainSc:        Isolation Precautions No active isolations  Medications Medications  iohexol (OMNIPAQUE) 350 MG/ML injection 75 mL (75 mLs Intravenous Contrast Given 09/14/18 1521)    Mobility manual wheelchair Low fall risk   Focused Assessments Cardiac Assessment Handoff:    No results found for: CKTOTAL, CKMB, CKMBINDEX, TROPONINI No results found for: DDIMER Does the Patient currently have chest pain? Yes      R Recommendations: See Admitting Provider Note  Report given to:   Additional Notes:  New O2 requirements

## 2018-09-14 NOTE — ED Notes (Signed)
Report given to Shay, RN.

## 2018-09-15 ENCOUNTER — Inpatient Hospital Stay (HOSPITAL_COMMUNITY)
Admit: 2018-09-15 | Discharge: 2018-09-15 | Disposition: A | Discharge status: Home / Self Care | Payer: Medicaid Other | Attending: Internal Medicine | Admitting: Internal Medicine

## 2018-09-15 DIAGNOSIS — R0602 Shortness of breath: Secondary | ICD-10-CM

## 2018-09-15 LAB — BASIC METABOLIC PANEL
Anion gap: 3 — ABNORMAL LOW (ref 5–15)
BUN: 25 mg/dL — ABNORMAL HIGH (ref 6–20)
CO2: 37 mmol/L — ABNORMAL HIGH (ref 22–32)
Calcium: 8.8 mg/dL — ABNORMAL LOW (ref 8.9–10.3)
Chloride: 102 mmol/L (ref 98–111)
Creatinine, Ser: 0.76 mg/dL (ref 0.44–1.00)
GFR calc Af Amer: >60 mL/min (ref >60)
GFR calc non Af Amer: >60 mL/min (ref >60)
Glucose, Bld: 103 mg/dL — ABNORMAL HIGH (ref 70–99)
Potassium: 5 mmol/L (ref 3.5–5.1)
Sodium: 142 mmol/L (ref 135–145)

## 2018-09-15 LAB — CBC
HCT: 38.4 % (ref 36.0–46.0)
Hemoglobin: 11 g/dL — ABNORMAL LOW (ref 12.0–15.0)
MCH: 26.3 pg (ref 26.0–34.0)
MCHC: 28.6 g/dL — ABNORMAL LOW (ref 30.0–36.0)
MCV: 91.6 fL (ref 80.0–100.0)
Platelets: 276 10*3/uL (ref 150–400)
RBC: 4.19 MIL/uL (ref 3.87–5.11)
RDW: 14.8 % (ref 11.5–15.5)
WBC: 10.7 10*3/uL — ABNORMAL HIGH (ref 4.0–10.5)
nRBC: 0 % (ref 0.0–0.2)

## 2018-09-15 LAB — ECHOCARDIOGRAM COMPLETE
Height: 57 in
Weight: 6804.28 oz

## 2018-09-15 MED ORDER — ALBUTEROL SULFATE (2.5 MG/3ML) 0.083% IN NEBU: 2.5000 mg | INHALATION_SOLUTION | Freq: Four times a day (QID) | RESPIRATORY_TRACT | Status: DC | PRN

## 2018-09-15 NOTE — Progress Notes (Signed)
*  PRELIMINARY RESULTS* Echocardiogram 2D Echocardiogram has been performed.  Sherrie Sport 09/15/2018, 10:42 AM

## 2018-09-15 NOTE — Progress Notes (Addendum)
FEV1 0.56   26% predicted  FVC 0.77L   29% predicted  FEV1/FVC   91% predicted

## 2018-09-15 NOTE — Progress Notes (Signed)
SATURATION QUALIFICATIONS: (This note is used to comply with regulatory documentation for home oxygen)  Attempted to wean patient off 2L-O2.   Patient Saturations on room air at rest = 85%  Placed patient on 1L-O2 and patient recovered to 93%.   Madlyn Frankel, RN

## 2018-09-15 NOTE — Consult Note (Signed)
Pulmonary Medicine          Date: 09/15/2018,   MRN# [id number] Shireen Quanlizabeth A Freyre 02/04/1966     AdmissionWeight: (!) 138.3 kg                 CurrentWeight: (!) 192.9 kg      CHIEF COMPLAINT:   Acute hypoxemic respiratory failure   HISTORY OF PRESENT ILLNESS   This is a pleasant 53 year old female, she is a lifelong non-smoker, with a medical history of essential hypertension, depression and spinal injury which required facet joint injections in 2010.  She reports that she was in her usual state of health celebrating her husband's birthday day of admission when she suddenly experienced altered mental status with inability to interact and pallor.  Her son noted that she was unable to speak and her husband came by with grandchild and noted that she is not reaching for her grandchild like she normally does.  Family subsequently called EMS who arrived 4 minutes after phone call and placed O2 monitor on her which showed desaturation to 70s.  She was subsequently placed on nasal cannula 3 L/min with improvement of desaturation.  Patient states that over the last several years she has felt episodes of dyspnea.  She admits to PND and states that she has excessive daytime sleepiness with significant fatigue and loss of energy.  While in the hospital she had a CT PE done which was negative for venous thromboembolism, pneumothorax or pneumonia.  CBC with mild leukocytosis and BMP showing mild AKI with metabolic acidosis.  Her COVID-19 testing was negative.   PAST MEDICAL HISTORY   Past Medical History:  Diagnosis Date  . Back injury   . Depression   . Hypertension      SURGICAL HISTORY   History reviewed. No pertinent surgical history.   FAMILY HISTORY   No family history on file.   SOCIAL HISTORY   Social History   Tobacco Use  . Smoking status: Never Smoker  . Smokeless tobacco: Never Used  Substance Use Topics  . Alcohol use: Not Currently  . Drug use: Never      MEDICATIONS    Home Medication:    Current Medication:  Current Facility-Administered Medications:  .  0.9 %  sodium chloride infusion, 250 mL, Intravenous, PRN, Shaune Pollackhen, Qing, MD .  0.9 %  sodium chloride infusion, , Intravenous, Continuous, Shaune Pollackhen, Qing, MD, Last Rate: 100 mL/hr at 09/15/18 [phone] .  acetaminophen (TYLENOL) tablet 650 mg, 650 mg, Oral, Q6H PRN **OR** acetaminophen (TYLENOL) suppository 650 mg, 650 mg, Rectal, Q6H PRN, Shaune Pollackhen, Qing, MD .  albuterol (PROVENTIL) (2.5 MG/3ML) 0.083% nebulizer solution 2.5 mg, 2.5 mg, Inhalation, Q6H, Shaune Pollackhen, Qing, MD, 2.5 mg at 09/15/18 0749 .  enoxaparin (LOVENOX) injection 40 mg, 40 mg, Subcutaneous, Q12H, Shaune Pollackhen, Qing, MD, 40 mg at 09/14/18 2143 .  guaiFENesin-dextromethorphan (ROBITUSSIN DM) 100-10 MG/5ML syrup 5 mL, 5 mL, Oral, Q4H PRN, Shaune Pollackhen, Qing, MD .  ondansetron [facility](ZOFRAN) tablet 4 mg, 4 mg, Oral, Q6H PRN **OR** ondansetron (ZOFRAN) injection 4 mg, 4 mg, Intravenous, Q6H PRN, Shaune Pollackhen, Qing, MD .  senna-docusate (Senokot-S) tablet 1 tablet, 1 tablet, Oral, QHS PRN, Shaune Pollackhen, Qing, MD .  sodium chloride flush (NS) 0.9 % injection 3 mL, 3 mL, Intravenous, Q12H, Shaune Pollackhen, Qing, MD .  sodium chloride flush (NS) 0.9 % injection 3 mL, 3 mL, Intravenous, PRN, Shaune Pollackhen, Qing, MD    ALLERGIES   Aspirin and Sulfa antibiotics  REVIEW OF SYSTEMS    Review of Systems:  Gen:  Denies  fever, sweats, chills weigh loss  HEENT: Denies blurred vision, double vision, ear pain, eye pain, hearing loss, nose bleeds, sore throat Cardiac:  No dizziness, chest pain or heaviness, chest tightness,edema Resp:   Denies cough or sputum porduction, shortness of breath,wheezing, hemoptysis,  Gi: Denies swallowing difficulty, stomach pain, nausea or vomiting, diarrhea, constipation, bowel incontinence Gu:  Denies bladder incontinence, burning urine Ext:   Denies Joint pain, stiffness or swelling Skin: Denies  skin rash, easy bruising or bleeding or hives Endoc:  Denies  polyuria, polydipsia , polyphagia or weight change Psych:   Denies depression, insomnia or hallucinations   Other:  All other systems negative   VS: BP (!) 116/59 (BP Location: Left Arm)   Pulse 98   Temp 98.4 F (36.9 C) (Oral)   Resp 20   Ht 4\' 9"  (1.448 m)   Wt (!) 192.9 kg   SpO2 98%   BMI 92.03 kg/m      PHYSICAL EXAM    GENERAL:NAD, no fevers, chills, no weakness no fatigue HEAD: Normocephalic, atraumatic.  EYES: Pupils equal, round, reactive to light. Extraocular muscles intact. No scleral icterus.  MOUTH: Moist mucosal membrane. Dentition intact. No abscess noted.  EAR, NOSE, THROAT: Clear without exudates. No external lesions.  NECK: Supple. No thyromegaly. No nodules. No JVD.  PULMONARY: Diffuse coarse rhonchi right sided +wheezes CARDIOVASCULAR: S1 and S2. Regular rate and rhythm. No murmurs, rubs, or gallops. No edema. Pedal pulses 2+ bilaterally.  GASTROINTESTINAL: Soft, nontender, nondistended. No masses. Positive bowel sounds. No hepatosplenomegaly.  MUSCULOSKELETAL: No swelling, clubbing, or edema. Range of motion full in all extremities.  NEUROLOGIC: Cranial nerves II through XII are intact. No gross focal neurological deficits. Sensation intact. Reflexes intact.  SKIN: No ulceration, lesions, rashes, or cyanosis. Skin warm and dry. Turgor intact.  PSYCHIATRIC: Mood, affect within normal limits. The patient is awake, alert and oriented x 3. Insight, judgment intact.       IMAGING    Ct Angio Chest Pe W And/or Wo Contrast  Result Date: 09/14/2018 CLINICAL DATA:  Chest pain. EXAM: CT ANGIOGRAPHY CHEST WITH CONTRAST TECHNIQUE: Multidetector CT imaging of the chest was performed using the standard protocol during bolus administration of intravenous contrast. Multiplanar CT image reconstructions and MIPs were obtained to evaluate the vascular anatomy. CONTRAST:  75mL OMNIPAQUE IOHEXOL 350 MG/ML SOLN COMPARISON:  Radiograph of same day. FINDINGS: Cardiovascular:  Satisfactory opacification of the pulmonary arteries to the segmental level. No evidence of pulmonary embolism. Mild cardiomegaly is noted. No pericardial effusion. Mediastinum/Nodes: No enlarged mediastinal, hilar, or axillary lymph nodes. Thyroid gland, trachea, and esophagus demonstrate no significant findings. Lungs/Pleura: Lungs are clear. No pleural effusion or pneumothorax. Upper Abdomen: No acute abnormality. Musculoskeletal: No chest wall abnormality. No acute or significant osseous findings. Review of the MIP images confirms the above findings. IMPRESSION: No definite evidence of pulmonary embolus. No acute abnormality seen in the chest. Electronically Signed   By: Lupita RaiderJames  Green Jr M.D.   On: 09/14/2018 15:48   Dg Chest Portable 1 View  Result Date: 09/14/2018 CLINICAL DATA:  Shortness of breath and chest pain for 3 days. EXAM: PORTABLE CHEST 1 VIEW COMPARISON:  08/31/2004 FINDINGS: Mediastinal contours appear intact. There is no evidence of focal airspace consolidation, pleural effusion or pneumothorax. Osseous structures are without acute abnormality. Soft tissues are grossly normal. IMPRESSION: No active disease. Electronically Signed   By: Ulanda Edisonobrinka  Dimitrova M.D.  On: 09/14/2018 14:43      ASSESSMENT/PLAN   Acute hypoxemic respiratory failure   - Likely due to atelctasis    - now resolved    - patient with metabolic alkalosis likely due to chronic hypoventilation   - will perform ABG now and evaluate for OSA/OHS   - discussed with Rhina Brackett [employer] to eval for possible trilogy device    - will need PFT and outpatient follow up    - [facility] pulmonary appt set up for office visit    - may d/c home from pulmonary perspective once we have evaluated ABG with plan for close follow up post d/c      Thank you for allowing me to participate in the care of this patient.    Patient/Family are satisfied with care plan and all questions have been answered.  This document was prepared  using Dragon voice recognition software and may include unintentional dictation errors.     Ottie Glazier, M.D.  Division of [facility]

## 2018-09-15 NOTE — Progress Notes (Signed)
[place] [facility] [place] NAME: Josilyn Shippee    MR#:  [id number]  DATE OF BIRTH:  06/11/1965  SUBJECTIVE:  CHIEF COMPLAINT:   Chief Complaint  Patient presents with  . Shortness of Breath   New complaint.  No fevers.  Resting comfortably. REVIEW OF SYSTEMS:  Review of Systems  Constitutional: Negative for chills and fever.  HENT: Negative for hearing loss and tinnitus.   Eyes: Negative for blurred vision.  Respiratory: Positive for shortness of breath. Negative for cough.   Cardiovascular: Negative for chest pain and palpitations.  Gastrointestinal: Negative for heartburn.  Genitourinary: Negative for dysuria and urgency.  Musculoskeletal: Negative for myalgias and neck pain.  Skin: Negative for itching and rash.  Neurological: Negative for dizziness and headaches.  Psychiatric/Behavioral: Negative for depression and hallucinations.    DRUG ALLERGIES:   Allergies  Allergen Reactions  . Aspirin   . Sulfa Antibiotics    VITALS:  Blood pressure (!) 116/59, pulse 98, temperature 98.4 F (36.9 C), temperature source Oral, resp. rate 20, height 4\' 9"  (1.448 m), weight (!) 192.9 kg, SpO2 93 %. PHYSICAL EXAMINATION:   GENERAL:  53 y.o.-year-old patient lying in the bed with no acute distress.  Morbid obesity. EYES: Pupils equal, round, reactive to light and accommodation. No scleral icterus. Extraocular muscles intact.  HEENT: Head atraumatic, normocephalic. Oropharynx and nasopharynx clear.  NECK:  Supple, no jugular venous distention. No thyroid enlargement, no tenderness.  LUNGS: Normal breath sounds bilaterally, no wheezing, rales,rhonchi or crepitation. No use of accessory muscles of respiration.  CARDIOVASCULAR: S1, S2 normal. No murmurs, rubs, or gallops.  ABDOMEN: Soft, nontender, nondistended. Bowel sounds present. No organomegaly or mass.  EXTREMITIES: No pedal edema, cyanosis, or clubbing.  NEUROLOGIC: Cranial nerves II  through XII are intact. Muscle strength 5/5 in all extremities. Sensation intact. Gait not checked.  PSYCHIATRIC: The patient is alert and oriented x 3.  SKIN: No obvious rash, lesion, or ulcer.  LABORATORY PANEL:  Female CBC Recent Labs  Lab 09/15/18 0534  WBC 10.7*  HGB 11.0*  HCT 38.4  PLT 276   ------------------------------------------------------------------------------------------------------------------ Chemistries  Recent Labs  Lab 09/14/18 1418 09/14/18 1819 09/15/18 0534  NA 140  --  142  K 5.3*  --  5.0  CL 99  --  102  CO2 33*  --  37*  GLUCOSE 120*  --  103*  BUN 31*  --  25*  CREATININE 1.07*  --  0.76  CALCIUM 8.6*  --  8.8*  MG 1.9 1.9  --   AST 19  --   --   ALT 14  --   --   ALKPHOS 72  --   --   BILITOT 0.6  --   --    RADIOLOGY:  Ct Angio Chest Pe W And/or Wo Contrast  Result Date: 09/14/2018 CLINICAL DATA:  Chest pain. EXAM: CT ANGIOGRAPHY CHEST WITH CONTRAST TECHNIQUE: Multidetector CT imaging of the chest was performed using the standard protocol during bolus administration of intravenous contrast. Multiplanar CT image reconstructions and MIPs were obtained to evaluate the vascular anatomy. CONTRAST:  76mL OMNIPAQUE IOHEXOL 350 MG/ML SOLN COMPARISON:  Radiograph of same day. FINDINGS: Cardiovascular: Satisfactory opacification of the pulmonary arteries to the segmental level. No evidence of pulmonary embolism. Mild cardiomegaly is noted. No pericardial effusion. Mediastinum/Nodes: No enlarged mediastinal, hilar, or axillary lymph nodes. Thyroid gland, trachea, and esophagus demonstrate no significant findings. Lungs/Pleura: Lungs are clear. No pleural  effusion or pneumothorax. Upper Abdomen: No acute abnormality. Musculoskeletal: No chest wall abnormality. No acute or significant osseous findings. Review of the MIP images confirms the above findings. IMPRESSION: No definite evidence of pulmonary embolus. No acute abnormality seen in the chest.  Electronically Signed   By: Lupita RaiderJames  Green Jr M.D.   On: 09/14/2018 15:48   Dg Chest Portable 1 View  Result Date: 09/14/2018 CLINICAL DATA:  Shortness of breath and chest pain for 3 days. EXAM: PORTABLE CHEST 1 VIEW COMPARISON:  08/31/2004 FINDINGS: Mediastinal contours appear intact. There is no evidence of focal airspace consolidation, pleural effusion or pneumothorax. Osseous structures are without acute abnormality. Soft tissues are grossly normal. IMPRESSION: No active disease. Electronically Signed   By: Ted Mcalpineobrinka  Dimitrova M.D.   On: 09/14/2018 14:43   ASSESSMENT AND PLAN:   1. Acute respiratory failure with hypoxia.  Unclear etiology.  CTA chest done with no pulmonary embolism or pneumonia. 2D echocardiogram done to evaluate cardiac function.  Follow-up on report when read. Patient seen by pulmonologist.  Noted to have metabolic alkalosis likely due to chronic hypoventilation.  Requested for ABG with plans to evaluate for OSA/OHS -Pulmonologist making arrangements to arrange for possible trilogy.  Will need PFT as outpatient. Follow-up with pulmonologist post discharge from the hospital. Oxygen requirement being weaned down with oxygen saturation of 93% on 1 L this morning. Notified nursing staff to update case manager regarding arrangements for trilogy since patient has no insurance.  2.Hyperkalemia.  Resolved.  3. Dehydration, IV fluid support.  4. Morbid obesity, possible OSA.  Lifestyle modifications including diet control and follow-up PCP as outpatient.  DVT prophylaxis; Lovenox  All the records are reviewed and case discussed with Care Management/Social Worker. Management plans discussed with the patient, family and they are in agreement.  CODE STATUS: Full Code  TOTAL TIME TAKING CARE OF THIS PATIENT: 36 minutes.   More than 50% of the time was spent in counseling/coordination of care: YES  POSSIBLE D/C IN 1-2 DAYS, DEPENDING ON CLINICAL CONDITION.   Oluwasemilore Bahl M.D  on 09/15/2018 at 12:33 PM  Between 7am to 6pm - Pager - [phone]  After 6pm go to [email or web address] - Scientist, research (life sciences)password EPAS [email or web address]  [facility] [place] [facility]  Office  [phone]  CC: Primary care physician; Center, [facility]  Note: This dictation was prepared with Dragon dictation along with smaller phrase technology. Any transcriptional errors that result from this process are unintentional.

## 2018-09-16 LAB — BASIC METABOLIC PANEL
Anion gap: 6 (ref 5–15)
BUN: 21 mg/dL — ABNORMAL HIGH (ref 6–20)
CO2: 34 mmol/L — ABNORMAL HIGH (ref 22–32)
Calcium: 8.4 mg/dL — ABNORMAL LOW (ref 8.9–10.3)
Chloride: 99 mmol/L (ref 98–111)
Creatinine, Ser: 0.75 mg/dL (ref 0.44–1.00)
GFR calc Af Amer: >60 mL/min (ref >60)
GFR calc non Af Amer: >60 mL/min (ref >60)
Glucose, Bld: 102 mg/dL — ABNORMAL HIGH (ref 70–99)
Potassium: 4.6 mmol/L (ref 3.5–5.1)
Sodium: 139 mmol/L (ref 135–145)

## 2018-09-16 LAB — CBC
HCT: 37.2 % (ref 36.0–46.0)
Hemoglobin: 10.7 g/dL — ABNORMAL LOW (ref 12.0–15.0)
MCH: 26.6 pg (ref 26.0–34.0)
MCHC: 28.8 g/dL — ABNORMAL LOW (ref 30.0–36.0)
MCV: 92.5 fL (ref 80.0–100.0)
Platelets: 285 10*3/uL (ref 150–400)
RBC: 4.02 MIL/uL (ref 3.87–5.11)
RDW: 14.6 % (ref 11.5–15.5)
WBC: 10.3 10*3/uL (ref 4.0–10.5)
nRBC: 0 % (ref 0.0–0.2)

## 2018-09-16 LAB — MAGNESIUM: Magnesium: 1.8 mg/dL (ref 1.7–2.4)

## 2018-09-16 LAB — HEMOGLOBIN A1C
Hgb A1c MFr Bld: 6.1 % — ABNORMAL HIGH (ref 4.8–5.6)
Mean Plasma Glucose: 128 mg/dL

## 2018-09-16 LAB — HIV ANTIBODY (ROUTINE TESTING W REFLEX): HIV Screen 4th Generation wRfx: NONREACTIVE

## 2018-09-16 NOTE — Discharge Summary (Addendum)
[facility] - [place] at [facility]   PATIENT NAME: Kaitlyn Taylor    MR#:  [id number]  DATE OF BIRTH:  09/03/1965  DATE OF ADMISSION:  09/14/2018   ADMITTING PHYSICIAN: Shaune PollackQing Chen, MD  DATE OF DISCHARGE: 09/16/2018  PRIMARY CARE PHYSICIAN: Center, [facility]   ADMISSION DIAGNOSIS:  SOB (shortness of breath) [R06.02] Respiratory failure with hypoxia, unspecified chronicity (HCC) [J96.91] DISCHARGE DIAGNOSIS:  Active Problems:   Acute respiratory failure with hypoxia (HCC)  SECONDARY DIAGNOSIS:   Past Medical History:  Diagnosis Date   Back injury    Depression    Hypertension    HOSPITAL COURSE:  Chief complaint ; SOB  HPI; Kaitlyn Taylor  is a 53 y.o. female with a known history of pneumonia, hypertension, depression and back injury.  The patient has had worsening shortness of breath for the past 2 weeks.  She also complains of mild cough and orthopnea but no wheezing, chest pain, fever or chills.  No leg edema.  She is found hypoxia at 70s percent in the ED and put on oxygen by nasal cannula.  CT angios of the chest did not show any PE or pneumonia.  ED physician request admission.  Hospital course: 1. Acute on chronic hypoxic respiratory failure  Secondary to obesity hypoventilation syndrome.  Chest done with no pulmonary embolism or pneumonia.  2D echocardiogram done which normal ejection fraction of 60 to 65%. Patient seen by pulmonologist.  Noted to have metabolic alkalosis likely due to chronic hypoventilation.  Patient noted to be hypoxic.  Hypoxia improved with supplemental oxygen between 1 to 2 L.  Nocturnal pulse oximetry done last night with evidence of episodes of significant hypoxia.  Patient being discharged on home oxygen therapy.  Pulmonologist making arrangements with [facility] [employer] [facility] and he has agreed to attempt to provide charity care services for home BIPAP device. [place] oximetry ordered and spirometry has  been performed (FEV1 0.56 26% predicted , FVC 0.77L 29% predicted).  [facility] post discharge from hospital Patient clinically and hemodynamically stable.  Wishes to be discharged home today.  Case manager setting up home health with physical therapy and skilled nursing.  2.Hyperkalemia.  Resolved.  3. Dehydration, Adequately hydrated with IV fluids.  4. Morbid obesity with BMI of 92.03,  Lifestyle modifications including diet control and follow-up PCP as outpatient.   DISCHARGE CONDITIONS:  Stable CONSULTS OBTAINED:  Treatment Team:  Vida RiggerAleskerov, Fuad, MD DRUG ALLERGIES:   Allergies  Allergen Reactions   Aspirin    Sulfa Antibiotics    DISCHARGE MEDICATIONS:   Allergies as of 09/16/2018      Reactions   Aspirin    Sulfa Antibiotics       Medication List    STOP taking these medications   carvedilol 25 MG tablet Commonly known as: COREG   ibuprofen 600 MG tablet Commonly known as: ADVIL   lisinopril-hydrochlorothiazide 20-25 MG tablet Commonly known as: ZESTORETIC   traZODone 100 MG tablet Commonly known as: DESYREL     TAKE these medications   albuterol 108 (90 Base) MCG/ACT inhaler Commonly known as: VENTOLIN HFA Inhale 2 puffs into the lungs every 6 (six) hours as needed for wheezing or shortness of breath.   cetirizine 10 MG tablet Commonly known as: ZYRTEC Take 10 mg by mouth at bedtime.   cyclobenzaprine 10 MG tablet Commonly known as: FLEXERIL Take 10 mg by mouth at bedtime as needed for muscle spasms.   omeprazole 20  MG capsule Commonly known as: PRILOSEC Take 20 mg by mouth daily.            Durable Medical Equipment  (From admission, onward)         Start     Ordered   09/16/18 1103  DME Oxygen  Once    Question Answer Comment  Length of Need 12 Months   Mode or (Route) Nasal cannula   Liters per Minute 2   Frequency Continuous (stationary and portable oxygen unit needed)   Oxygen conserving  device Yes   Oxygen delivery system Gas      09/16/18 1102           DISCHARGE INSTRUCTIONS:   DIET:  Cardiac diet DISCHARGE CONDITION:  Stable ACTIVITY:  Activity as tolerated OXYGEN:  Home Oxygen: Yes.    Oxygen Delivery: Oxygen via nasal cannula at 2 L. DISCHARGE LOCATION:  home   If you experience worsening of your admission symptoms, develop shortness of breath, life threatening emergency, suicidal or homicidal thoughts you must seek medical attention immediately by calling 911 or calling your MD immediately  if symptoms less severe.  You Must read complete instructions/literature along with all the possible adverse reactions/side effects for all the Medicines you take and that have been prescribed to you. Take any new Medicines after you have completely understood and accpet all the possible adverse reactions/side effects.   Please note  You were cared for by a hospitalist during your hospital stay. If you have any questions about your discharge medications or the care you received while you were in the hospital after you are discharged, you can call the unit and asked to speak with the hospitalist on call if the hospitalist that took care of you is not available. Once you are discharged, your primary care physician will handle any further medical issues. Please note that NO REFILLS for any discharge medications will be authorized once you are discharged, as it is imperative that you return to your primary care physician (or establish a relationship with a primary care physician if you do not have one) for your aftercare needs so that they can reassess your need for medications and monitor your lab values.    On the day of Discharge:  VITAL SIGNS:  Blood pressure (!) 109/49, pulse (!) 110, temperature 98.7 F (37.1 C), temperature source Oral, resp. rate 18, height 4\' 9"  (1.448 m), weight (!) 192.9 kg, SpO2 92 %. PHYSICAL EXAMINATION:  GENERAL:  53 y.o.-year-old patient  lying in the bed with no acute distress.  Morbid obesity EYES: Pupils equal, round, reactive to light and accommodation. No scleral icterus. Extraocular muscles intact.  HEENT: Head atraumatic, normocephalic. Oropharynx and nasopharynx clear.  NECK:  Supple, no jugular venous distention. No thyroid enlargement, no tenderness.  LUNGS: Normal breath sounds bilaterally, no wheezing, rales,rhonchi or crepitation. No use of accessory muscles of respiration.  CARDIOVASCULAR: S1, S2 normal. No murmurs, rubs, or gallops.  ABDOMEN: Soft, non-tender, non-distended. Bowel sounds present. No organomegaly or mass.  EXTREMITIES: No pedal edema, cyanosis, or clubbing.  NEUROLOGIC: Cranial nerves II through XII are intact. Muscle strength 5/5 in all extremities. Sensation intact. Gait not checked.  PSYCHIATRIC: The patient is alert and oriented x 3.  SKIN: No obvious rash, lesion, or ulcer.  DATA REVIEW:   CBC Recent Labs  Lab 09/16/18 0404  WBC 10.3  HGB 10.7*  HCT 37.2  PLT 285    Chemistries  Recent Labs  Lab 09/14/18 1418  09/16/18 0404  NA 140   < > 139  K 5.3*   < > 4.6  CL 99   < > 99  CO2 33*   < > 34*  GLUCOSE 120*   < > 102*  BUN 31*   < > 21*  CREATININE 1.07*   < > 0.75  CALCIUM 8.6*   < > 8.4*  MG 1.9   < > 1.8  AST 19  --   --   ALT 14  --   --   ALKPHOS 72  --   --   BILITOT 0.6  --   --    < > = values in this interval not displayed.     Microbiology Results  Results for orders placed or performed during the hospital encounter of 09/14/18  SARS Coronavirus 2 (CEPHEID - Performed in [facility] hospital lab), Hosp Order     Status: None   Collection Time: 09/14/18  2:19 PM   Specimen: Nasopharyngeal Swab  Result Value Ref Range Status   SARS Coronavirus 2 NEGATIVE NEGATIVE Final    Comment: (NOTE) If result is NEGATIVE SARS-CoV-2 target nucleic acids are NOT DETECTED. The SARS-CoV-2 RNA is generally detectable in upper and lower  respiratory specimens during the  acute phase of infection. The lowest  concentration of SARS-CoV-2 viral copies this assay can detect is 250  copies / mL. A negative result does not preclude SARS-CoV-2 infection  and should not be used as the sole basis for treatment or other  patient management decisions.  A negative result may occur with  improper specimen collection / handling, submission of specimen other  than nasopharyngeal swab, presence of viral mutation(s) within the  areas targeted by this assay, and inadequate number of viral copies  (<250 copies / mL). A negative result must be combined with clinical  observations, patient history, and epidemiological information. If result is POSITIVE SARS-CoV-2 target nucleic acids are DETECTED. The SARS-CoV-2 RNA is generally detectable in upper and lower  respiratory specimens dur ing the acute phase of infection.  Positive  results are indicative of active infection with SARS-CoV-2.  Clinical  correlation with patient history and other diagnostic information is  necessary to determine patient infection status.  Positive results do  not rule out bacterial infection or co-infection with other viruses. If result is PRESUMPTIVE POSTIVE SARS-CoV-2 nucleic acids MAY BE PRESENT.   A presumptive positive result was obtained on the submitted specimen  and confirmed on repeat testing.  While 2019 novel coronavirus  (SARS-CoV-2) nucleic acids may be present in the submitted sample  additional confirmatory testing may be necessary for epidemiological  and / or clinical management purposes  to differentiate between  SARS-CoV-2 and other Sarbecovirus currently known to infect humans.  If clinically indicated additional testing with an alternate test  methodology [phone] is advised. The SARS-CoV-2 RNA is generally  detectable in upper and lower respiratory sp ecimens during the acute  phase of infection. The expected result is Negative. Fact Sheet for Patients:   BoilerBrush.com.cyhttps://www.fda.gov/media/136312/download Fact Sheet for Healthcare Providers: https://pope.com/https://www.fda.gov/media/136313/download This test is not yet approved or cleared by the [place] FDA and has been authorized for detection and/or diagnosis of SARS-CoV-2 by FDA under an Emergency Use Authorization (EUA).  This EUA will remain in effect (meaning this test can be used) for the duration of the COVID-19 declaration under Section 564(b)(1) of the Act, 21 U.S.C. section 360bbb-3(b)(1), unless the authorization is terminated or revoked sooner. Performed  at [facility], [address] [place], [place] [address]     RADIOLOGY:  No results found.   Management plans discussed with the patient, family and they are in agreement.  CODE STATUS: Full Code   TOTAL TIME TAKING CARE OF THIS PATIENT: 36 minutes.    Peyton Spengler M.D on 09/16/2018 at 11:04 AM  Between 7am to 6pm - Pager - [phone]  After 6pm go to [email or web address] - Technical brewer [place] [facility]  Office  [phone]  CC: Primary care physician; Center, [facility]   Note: This dictation was prepared with Dragon dictation along with smaller phrase technology. Any transcriptional errors that result from this process are unintentional.

## 2018-09-16 NOTE — Progress Notes (Signed)
Discharge instructions given and went over with patient at bedside. All questions answered. Patient discharged home with husband with home oxygen. Madlyn Frankel, RN

## 2018-09-16 NOTE — TOC Transition Note (Signed)
Transition of Care [facility]) - CM/SW Discharge Note   Patient Details  Name: Kaitlyn Taylor MRN: [id number] Date of Birth: 28-Feb-1966  Transition of [facility] CM/SW Contact:  Shelbie Hutching, RN Phone Number: 09/16/2018, 2:07 PM   Clinical Narrative:    Patient will discharge home today.  Patient's husband will pick her up.  Brad with [employer] will deliver O2 to the bedside.  Financial Counselor has started the application for Medicaid and will have it filed tomorrow.  Patient should have a Medicaid Pending number by Monday.  Home Health to be provided by [place].    Final next level of care: [place] Barriers to Discharge: Barriers Resolved   Patient Goals and CMS Choice Patient states their goals for this hospitalization and ongoing recovery are:: Ready to get home CMS Medicare.gov Compare Post Acute Care list provided to:: Patient Choice offered to / list presented to : Patient  Discharge Placement                       Discharge Plan and Services   Discharge Planning Services: CM Consult Post Acute Care Choice: Home Health, Durable Medical Equipment          DME Arranged: Oxygen DME Agency: [employer] Date DME Agency Contacted: 09/16/18 Time DME Agency Contacted: 1062 Representative spoke with at DME Agency: [place]: RN, PT [facility] Agency: [place] ([place]) Date [place]: 09/16/18 Time [place]: 1406 Representative spoke with at [place]: [place] ([place]) Interventions     Readmission Risk Interventions No flowsheet data found.

## 2018-09-16 NOTE — TOC Initial Note (Signed)
Transition of Care [facility]) - Initial/Assessment Note    Patient Details  Name: Kaitlyn Taylor MRN: [id number] Date of Birth: [birth year]  Transition of Care [facility]) CM/SW Contact:    Shelbie Hutching, RN Phone Number: 09/16/2018, 8:42 AM  Clinical Narrative:                 Patient admitted to the hospital with acute respiratory failure with hypoxia, patient reported that she had been having shortness of breath for about 3 weeks and not feeling well.  Patient lives with her husband in [place], she does not get out of the house much.  Patient reports she is able to get around her home with walker and cane, she reports she does have a wheelchair but it is falling apart.  Patient qualifies for home oxygen, may qualify for home trilogy.  Patient will need home health services at discharge, [facility] is on charity rotation, patient does not currently have insurance and her husband is on disability.  Santiago Glad with [facility] given referral for home health.   RNCM contacted financial counseling and they have spoken with patient about [facility] care for this hospitalization, they will reach out to her again today to discuss applying for Medicaid and disability.  Patient reports that she has applied in the past for disability and been denied.  Patient will need extensive follow up.  PCP verified as [facility], patient gets her prescriptions through a charity mail order service for free and she can get prescriptions from [facility] if needed immediately.   Possible discharge this afternoon.      Barriers to Discharge: Continued Medical Work up   Patient Goals and CMS Choice Patient states their goals for this hospitalization and ongoing recovery are:: Ready to get home CMS Medicare.gov Compare Post Acute Care list provided to:: Patient Choice offered to / list presented to : Patient  Expected Discharge Plan and Services     Discharge Planning Services: CM Consult Post Acute Care Choice: Home Health,  Durable Medical Equipment Living arrangements for the past 2 months: Single Family Home                 DME Arranged: Oxygen DME Agency: [employer] Date DME Agency Contacted: 09/16/18 Time DME Agency Contacted: [phone] Representative spoke with at DME Agency: [place] Arranged: RN, PT [facility] Agency: [place] ([place]) Date [place]: 09/16/18 Time [place]: [phone] Representative spoke with at [place]: Santiago Glad  Prior Living Arrangements/Services Living arrangements for the past 2 months: [place] with:: Spouse Patient language and need for interpreter reviewed:: No Do you feel safe going back to the place where you live?: Yes      Need for Family Participation in Patient Care: Yes (Comment)(needs follow up and going home with oxygen) Care giver support system in place?: Yes (comment)(husband, son and son in law)   Criminal Activity/Legal Involvement Pertinent to Current Situation/Hospitalization: No - Comment as needed  Activities of Daily Living Home Assistive Devices/Equipment: Wheelchair, Eyeglasses, Grab bars in shower, Grab bars around toilet ADL Screening (condition at time of admission) Patient's cognitive ability adequate to safely complete daily activities?: Yes Is the patient deaf or have difficulty hearing?: No Does the patient have difficulty seeing, even when wearing glasses/contacts?: No Does the patient have difficulty concentrating, remembering, or making decisions?: No Patient able to express need for assistance with ADLs?: Yes Does the patient have difficulty dressing or bathing?: Yes Independently performs ADLs?:  No Communication: Independent Dressing (OT): Independent Grooming: Independent Feeding: Independent Bathing: Independent with device (comment) Toileting: Independent with device (comment) In/Out Bed: Independent with device (comment) Walks in Home: Independent with device (comment) Does the patient  have difficulty walking or climbing stairs?: Yes Weakness of Legs: Both Weakness of Arms/Hands: None  Permission Sought/Granted Permission sought to share information with : Case Manager, Family Supports, Other (comment) Permission granted to share information with : Yes, Verbal Permission Granted     Permission granted to share info w AGENCY: [facility]  Permission granted to share info w Relationship: Husband     Emotional Assessment Appearance:: Appears stated age Attitude/Demeanor/Rapport: Engaged Affect (typically observed): Accepting Orientation: : Oriented to Self, Oriented to Place, Oriented to  Time, Oriented to Situation Alcohol / Substance Use: Not Applicable Psych Involvement: No (comment)  Admission diagnosis:  SOB (shortness of breath) [R06.02] Respiratory failure with hypoxia, unspecified chronicity (HCC) [J96.91] Patient Active Problem List   Diagnosis Date Noted  . Acute respiratory failure with hypoxia (HCC) 09/14/2018   PCP:  [facility]:   [facility] [place] [address], [place] [address] [place] [address] Phone: [phone] Fax: [phone]     Social Determinants of Health (SDOH) Interventions    Readmission Risk Interventions No flowsheet data found.

## 2018-09-16 NOTE — Clinical Social Work Note (Signed)
Pt continues to exhibit signs of hypercapnia associated with chronic respiratory failure secondary to severe obesity hypoventilation syndrome. Patient requires the use of NIV both QHS and daytime to help with exacerbation periods. The use of the NIV will treat patient's high PCO2 levels and can reduce risk of exacerbations and future hospitalizations when used at night and during the day. Pt will need these advanced settings in conjunction with her current medication regimen; BIPAP is not an option due to its functional limiitations and the severity of the patient's condition. Failure to have NIV available for use over a 24 hour period could lead to death. Patient is able to maintain their own airway and clear their secretions.

## 2018-09-16 NOTE — Consult Note (Signed)
Pulmonary Medicine          Date: 09/16/2018,   MRN# [id number] Kaitlyn Taylor 1966-01-22     AdmissionWeight: (!) 138.3 kg                 CurrentWeight: (!) 192.9 kg      CHIEF COMPLAINT:   Acute hypoxemic respiratory failure   SUBJECTIVE   Patient states she feels better.  No events overnight. Discussed with CM and [employer] due to financial hardship.   PAST MEDICAL HISTORY   Past Medical History:  Diagnosis Date  . Back injury   . Depression   . Hypertension      SURGICAL HISTORY   History reviewed. No pertinent surgical history.   FAMILY HISTORY   No family history on file.   SOCIAL HISTORY   Social History   Tobacco Use  . Smoking status: Never Smoker  . Smokeless tobacco: Never Used  Substance Use Topics  . Alcohol use: Not Currently  . Drug use: Never     MEDICATIONS    Home Medication:    Current Medication:  Current Facility-Administered Medications:  .  0.9 %  sodium chloride infusion, 250 mL, Intravenous, PRN, Demetrios Loll, MD .  acetaminophen (TYLENOL) tablet 650 mg, 650 mg, Oral, Q6H PRN **OR** acetaminophen (TYLENOL) suppository 650 mg, 650 mg, Rectal, Q6H PRN, Demetrios Loll, MD .  albuterol (PROVENTIL) (2.5 MG/3ML) 0.083% nebulizer solution 2.5 mg, 2.5 mg, Inhalation, Q6H PRN, [place], Jude, MD .  enoxaparin (LOVENOX) injection 40 mg, 40 mg, Subcutaneous, Q12H, Demetrios Loll, MD, 40 mg at 09/15/18 2124 .  guaiFENesin-dextromethorphan (ROBITUSSIN DM) 100-10 MG/5ML syrup 5 mL, 5 mL, Oral, Q4H PRN, Demetrios Loll, MD .  ondansetron [facility]) tablet 4 mg, 4 mg, Oral, Q6H PRN **OR** ondansetron (ZOFRAN) injection 4 mg, 4 mg, Intravenous, Q6H PRN, Demetrios Loll, MD .  senna-docusate (Senokot-S) tablet 1 tablet, 1 tablet, Oral, QHS PRN, Demetrios Loll, MD .  sodium chloride flush (NS) 0.9 % injection 3 mL, 3 mL, Intravenous, Q12H, Demetrios Loll, MD .  sodium chloride flush (NS) 0.9 % injection 3 mL, 3 mL, Intravenous, PRN, Demetrios Loll, MD, 3 mL  at 09/15/18 2124    ALLERGIES   Aspirin and Sulfa antibiotics     REVIEW OF SYSTEMS    Review of Systems:  Gen:  Denies  fever, sweats, chills weigh loss  HEENT: Denies blurred vision, double vision, ear pain, eye pain, hearing loss, nose bleeds, sore throat Cardiac:  No dizziness, chest pain or heaviness, chest tightness,edema Resp:   Denies cough or sputum porduction, shortness of breath,wheezing, hemoptysis,  Gi: Denies swallowing difficulty, stomach pain, nausea or vomiting, diarrhea, constipation, bowel incontinence Gu:  Denies bladder incontinence, burning urine Ext:   Denies Joint pain, stiffness or swelling Skin: Denies  skin rash, easy bruising or bleeding or hives Endoc:  Denies polyuria, polydipsia , polyphagia or weight change Psych:   Denies depression, insomnia or hallucinations   Other:  All other systems negative   VS: BP (!) 109/49 (BP Location: Left Arm)   Pulse (!) 110   Temp 98.7 F (37.1 C) (Oral)   Resp 18   Ht 4\' 9"  (1.448 m)   Wt (!) 192.9 kg   SpO2 92%   BMI 92.03 kg/m      PHYSICAL EXAM    GENERAL:NAD, no fevers, chills, no weakness no fatigue HEAD: Normocephalic, atraumatic.  EYES: Pupils equal, round, reactive to light. Extraocular muscles intact. No  scleral icterus.  MOUTH: Moist mucosal membrane. Dentition intact. No abscess noted.  EAR, NOSE, THROAT: Clear without exudates. No external lesions.  NECK: Supple. No thyromegaly. No nodules. No JVD.  PULMONARY: CTAB decreased breath sounds.  CARDIOVASCULAR: S1 and S2. Regular rate and rhythm. No murmurs, rubs, or gallops. No edema. Pedal pulses 2+ bilaterally.  GASTROINTESTINAL: Soft, nontender, nondistended. No masses. Positive bowel sounds. No hepatosplenomegaly.  MUSCULOSKELETAL: No swelling, clubbing, or edema. Range of motion full in all extremities.  NEUROLOGIC: Cranial nerves II through XII are intact. No gross focal neurological deficits. Sensation intact. Reflexes intact.   SKIN: No ulceration, lesions, rashes, or cyanosis. Skin warm and dry. Turgor intact.  PSYCHIATRIC: Mood, affect within normal limits. The patient is awake, alert and oriented x 3. Insight, judgment intact.       IMAGING    Ct Angio Chest Pe W And/or Wo Contrast  Result Date: 09/14/2018 CLINICAL DATA:  Chest pain. EXAM: CT ANGIOGRAPHY CHEST WITH CONTRAST TECHNIQUE: Multidetector CT imaging of the chest was performed using the standard protocol during bolus administration of intravenous contrast. Multiplanar CT image reconstructions and MIPs were obtained to evaluate the vascular anatomy. CONTRAST:  75mL OMNIPAQUE IOHEXOL 350 MG/ML SOLN COMPARISON:  Radiograph of same day. FINDINGS: Cardiovascular: Satisfactory opacification of the pulmonary arteries to the segmental level. No evidence of pulmonary embolism. Mild cardiomegaly is noted. No pericardial effusion. Mediastinum/Nodes: No enlarged mediastinal, hilar, or axillary lymph nodes. Thyroid gland, trachea, and esophagus demonstrate no significant findings. Lungs/Pleura: Lungs are clear. No pleural effusion or pneumothorax. Upper Abdomen: No acute abnormality. Musculoskeletal: No chest wall abnormality. No acute or significant osseous findings. Review of the MIP images confirms the above findings. IMPRESSION: No definite evidence of pulmonary embolus. No acute abnormality seen in the chest. Electronically Signed   By: Lupita RaiderJames  Green Jr M.D.   On: 09/14/2018 15:48   Dg Chest Portable 1 View  Result Date: 09/14/2018 CLINICAL DATA:  Shortness of breath and chest pain for 3 days. EXAM: PORTABLE CHEST 1 VIEW COMPARISON:  08/31/2004 FINDINGS: Mediastinal contours appear intact. There is no evidence of focal airspace consolidation, pleural effusion or pneumothorax. Osseous structures are without acute abnormality. Soft tissues are grossly normal. IMPRESSION: No active disease. Electronically Signed   By: Ted Mcalpineobrinka  Dimitrova M.D.   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   - now resolved on 1-2L/min [place]    - patient with metabolic alkalosis likely due to chronic hypoventilation   - could not get ABG after multiple attempts due to anatomy  - patient with financial hardship, have discussed with Robbie LisJeanna Creech case manager regarding disability/medicaid/charity care - application in progress - Have discussed with Mardi MainlandBrad Leanard [employer] and he has agreed to attempt to provide charity care services for home BIPAP device - noct[place]a oximetry ordered and spirometry has been performed (FEV1 0.56   26% predicted , FVC 0.77L   29% predicted)   - [facility] appt set up for office visit    - may d/c home from pulmonary   TTE reviewed - essentially normal    Thank you for allowing me to participate in the care of this patient.    Patient/Family are satisfied with care plan and all questions have been answered.  This document was prepared using Dragon voice recognition software and may include unintentional dictation errors.     Vida RiggerFuad Ami Mally, M.D.  Division  of Pulmonary & [place]

## 2019-01-12 ENCOUNTER — Other Ambulatory Visit: Payer: Self-pay | Admitting: Family Medicine

## 2019-01-12 DIAGNOSIS — Z1231 Encounter for screening mammogram for malignant neoplasm of breast: Secondary | ICD-10-CM

## 2019-01-25 ENCOUNTER — Other Ambulatory Visit: Payer: Self-pay | Admitting: Internal Medicine

## 2019-01-25 DIAGNOSIS — I208 Other forms of angina pectoris: Secondary | ICD-10-CM

## 2019-02-07 ENCOUNTER — Other Ambulatory Visit: Payer: Self-pay

## 2019-02-07 ENCOUNTER — Ambulatory Visit
Admission: RE | Admit: 2019-02-07 | Discharge: 2019-02-07 | Disposition: A | Discharge status: Home / Self Care | Payer: Medicaid Other | Source: Ambulatory Visit | Attending: Internal Medicine | Admitting: Internal Medicine

## 2019-02-07 DIAGNOSIS — I208 Other forms of angina pectoris: Secondary | ICD-10-CM | POA: Insufficient documentation

## 2019-02-08 ENCOUNTER — Ambulatory Visit: Admission: RE | Admit: 2019-02-08 | Payer: Medicaid Other | Source: Ambulatory Visit

## 2019-02-18 ENCOUNTER — Other Ambulatory Visit: Payer: Self-pay | Admitting: Internal Medicine

## 2019-02-18 DIAGNOSIS — R0602 Shortness of breath: Secondary | ICD-10-CM

## 2019-04-11 ENCOUNTER — Telehealth: Payer: Self-pay

## 2019-04-11 NOTE — Telephone Encounter (Signed)
NO NOTE JUST REFERRAL

## 2019-04-11 NOTE — Telephone Encounter (Signed)
Referral notes sent from [facility] [phone]  Notes sent to scheduling

## 2019-04-22 ENCOUNTER — Other Ambulatory Visit: Payer: Self-pay

## 2019-04-22 ENCOUNTER — Encounter: Payer: Self-pay | Admitting: Cardiology

## 2019-04-22 ENCOUNTER — Ambulatory Visit (INDEPENDENT_AMBULATORY_CARE_PROVIDER_SITE_OTHER): Payer: Medicaid Other | Admitting: Cardiology

## 2019-04-22 VITALS — BP 124/82 | HR 95 | Ht <= 58 in | Wt 394.0 lb

## 2019-04-22 DIAGNOSIS — R600 Localized edema: Secondary | ICD-10-CM

## 2019-04-22 DIAGNOSIS — R079 Chest pain, unspecified: Secondary | ICD-10-CM

## 2019-04-22 DIAGNOSIS — R0600 Dyspnea, unspecified: Secondary | ICD-10-CM | POA: Diagnosis not present

## 2019-04-22 NOTE — Progress Notes (Signed)
Cardiology Office Note:    Date:  04/22/2019   ID:  Kaitlyn Taylor, DOB 1965-03-28, MRN [id number]  PCP:  [facility]  Cardiologist:  Debbe Odea, MD  Electrophysiologist:  None   Referring MD: [facility]*   Chief Complaint  Patient presents with  . other    C/o chest pain with exertion, heart pounding and edema ankles. Meds verbally reviewed with patient.    History of Present Illness:    Kaitlyn Taylor is a 54 y.o. female with a hx of hypertension, diabetes, morbid obesity, obesity hypoventilatory syndrome, respiratory failure who presents due to lower extremity edema.  Patient states having shortness of breath with minimal exertion.  She needs help with activities of daily living due to obesity.  She was admitted to the hospital last summer, 8 months ago due to respiratory failure.  She uses BiPAP at home.  She denies ever smoking but states being around secondhand smoke most of her life due to her mother smoking.  She has noticed lower extremity edema and was started on Lasix 40 mg daily.  She used to be on blood pressure medications for hypertension, but since her recent hospitalization for respiratory failure, her blood pressure has been normal off of blood pressure meds.  He only takes Lasix.  She states having chest discomfort usually when her 102-month-old granddaughter is put on her chest.  Pressing on her chest causing some chest discomfort.  Patient had a transthoracic echocardiogram on 09/2018 which showed normal ejection fraction with EF 60 to 65%.  Diastolic function could not be performed.  Moderate asymmetric septal hypertrophy was noted.  Past Medical History:  Diagnosis Date  . Acute respiratory failure with hypoxia (HCC)   . Back injury   . 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 Carpal tunnel syndrome   . CHF (congestive heart failure) (HCC)   . Common migraine   . COPD (chronic obstructive  pulmonary disease) (HCC)   . Decreased hearing   . Depression   . DM (diabetes mellitus), gestational, delivered   . GERD (gastroesophageal reflux disease)   . Heart murmur   . Hyperlipidemia   . Hypertension   . Hypothyroid   . Memory loss   . Obesity   . Osteoporosis   . Sleep apnea   . Vitamin D deficiency     Past Surgical History:  Procedure Laterality Date  . ABDOMINAL HYSTERECTOMY  1995  . APPENDECTOMY    . CESAREAN SECTION     x 2     Current Medications: Current Meds  Medication Sig  . albuterol (VENTOLIN HFA) 108 (90 Base) MCG/ACT inhaler Inhale into the lungs.  . cetirizine (ZYRTEC) 10 MG tablet Take 10 mg by mouth at bedtime.  . cyclobenzaprine (FLEXERIL) 10 MG tablet Take 10 mg by mouth at bedtime as needed for muscle spasms.  . furosemide (LASIX) 40 MG tablet Take 40 mg by mouth daily.   Marland Kitchen ibuprofen (ADVIL) 600 MG tablet Take by mouth.  Marland Kitchen ketoconazole (NIZORAL) 2 % cream APPLY TO AFFECTED AREA TWICE A DAY FOR 2 WEEKS  . levothyroxine (SYNTHROID) 50 MCG tablet Take 50 mcg by mouth daily before breakfast.  . omeprazole (PRILOSEC) 20 MG capsule Take 20 mg by mouth daily.  . traZODone (DESYREL) 100 MG tablet Take by mouth.  . Vitamin D, Ergocalciferol, (DRISDOL) 1.25 MG (50000 UNIT) CAPS capsule Take 50,000 Units by mouth every  7 (seven) days.  . [DISCONTINUED] carvedilol (COREG) 25 MG tablet Take 25 mg by mouth 2 (two) times daily with a meal.   . [DISCONTINUED] lisinopril-hydrochlorothiazide (ZESTORETIC) 20-25 MG tablet Take 1 tablet by mouth daily.      Allergies:   Aspirin, Other, and Sulfa antibiotics   Social History   Socioeconomic History  . Marital status: Married    Spouse name: Not on file  . Number of children: Not on file  . Years of education: Not on file  . Highest education level: Not on file  Occupational History  . Not on file  Tobacco Use  . Smoking status: Never Smoker  . Smokeless tobacco: Never Used  Substance and Sexual Activity   . Alcohol use: Not Currently  . Drug use: Never  . Sexual activity: Not on file  Other Topics Concern  . Not on file  Social History Narrative  . Not on file   Social Determinants of Health   Financial Resource Strain:   . Difficulty of Paying Living Expenses: Not on file  Food Insecurity:   . Worried About Charity fundraiser in the Last Year: Not on file  . Ran Out of Food in the Last Year: Not on file  Transportation Needs:   . Lack of Transportation (Medical): Not on file  . Lack of Transportation (Non-Medical): Not on file  Physical Activity:   . Days of Exercise per Week: Not on file  . Minutes of Exercise per Session: Not on file  Stress:   . Feeling of Stress : Not on file  Social Connections:   . Frequency of Communication with Friends and Family: Not on file  . Frequency of Social Gatherings with Friends and Family: Not on file  . Attends Religious Services: Not on file  . Active Member of Clubs or Organizations: Not on file  . Attends Archivist Meetings: Not on file  . Marital Status: Not on file     Family History: The patient's family history includes Breast cancer in her maternal grandmother, mother, and paternal grandmother; Heart attack in her father and paternal grandfather.  ROS:   Please see the history of present illness.     All other systems reviewed and are negative.  EKGs/Labs/Other Studies Reviewed:    The following studies were reviewed today:   EKG:  EKG was Ordered today.  The ekg ordered today demonstrates is normal sinus rhythm, nonspecific ST changes  Recent Labs: 09/14/2018: ALT 14; B Natriuretic Peptide 99.0 09/16/2018: BUN 21; Creatinine, Ser 0.75; Hemoglobin 10.7; Magnesium 1.8; Platelets 285; Potassium 4.6; Sodium 139  Recent Lipid Panel No results found for: CHOL, TRIG, HDL, CHOLHDL, VLDL, LDLCALC, LDLDIRECT  Physical Exam:    VS:  BP 124/82 (BP Location: Right Wrist, Patient Position: Sitting, Cuff Size: Large)    Pulse 95   Ht 4' 9.5" (1.461 m)   Wt (!) 394 lb (178.7 kg)   SpO2 99%   BMI 83.78 kg/m     Wt Readings from Last 3 Encounters:  04/22/19 (!) 394 lb (178.7 kg)  09/14/18 (!) 425 lb 4.3 oz (192.9 kg)     GEN:  Well nourished, well developed in no acute distress, morbidly obese, has oxygen per nasal cannula via oxygen tank. HEENT: Normal NECK: No JVD; No carotid bruits LYMPHATICS: No lymphadenopathy CARDIAC: RRR, no murmurs, rubs, gallops RESPIRATORY: Decreased breath sounds, poor inspiratory effort ABDOMEN: Soft, non-tender, distended MUSCULOSKELETAL: Tenderness with palpation of her chest.  1  to 2+ edema noted in the lower extremity SKIN: Warm and dry NEUROLOGIC:  Alert and oriented x 3 PSYCHIATRIC:  Normal affect   ASSESSMENT:    1. Chest pain of uncertain etiology   2. Dyspnea, unspecified type   3. Morbid obesity (HCC)   4. Lower extremity edema    PLAN:    In order of problems listed above:  1. Patient with very atypical chest pain.  Reproducible with palpation of the left sternum and left chest.  Not likely of cardiac origin.  Pain most likely due to musculoskeletal etiology. 2. Patient with nonspecific dyspnea.  Last echocardiogram shows normal ejection fraction.  Diastolic function could not be obtained due to poor acoustic windows.  We will plan to obtain limited echocardiogram with contrast and see if we could obtain diastolic function.  Her symptoms of dyspnea are likely secondary to morbidly obese, sleep apnea, obesity hypoventilatory syndrome. 3. Patient is morbidly obese.  BMI over 82.  She has lost 30 pounds over the past 2 months.  Patient was congratulated and encouraged to continue weight loss and low calorie diet. 4. Lower extremity edema is dependent in nature and likely due to chronic venous insufficiency especially with blueness at the ankles..  Leg raising while in a seated position encouraged.  She can continue Lasix as currently prescribed.  We will try to  get echocardiogram as mentioned above, last systolic function was normal.  Follow-up after echocardiogram  This note was generated in part or whole with voice recognition software. Voice recognition is usually quite accurate but there are transcription errors that can and very often do occur. I apologize for any typographical errors that were not detected and corrected.  Medication Adjustments/Labs and Tests Ordered: Current medicines are reviewed at length with the patient today.  Concerns regarding medicines are outlined above.  Orders Placed This Encounter  Procedures  . EKG 12-Lead  . ECHOCARDIOGRAM COMPLETE   No orders of the defined types were placed in this encounter.   Patient Instructions  Medication Instructions:  Your physician recommends that you continue on your current medications as directed. Please refer to the Current Medication list given to you today.  *If you need a refill on your cardiac medications before your next appointment, please call your pharmacy*  Lab Work: None ordered If you have labs (blood work) drawn today and your tests are completely normal, you will receive your results only by: Marland Kitchen MyChart Message (if you have MyChart) OR . A paper copy in the mail If you have any lab test that is abnormal or we need to change your treatment, we will call you to review the results.  Testing/Procedures: Your physician has requested that you have an echocardiogram. Echocardiography is a painless test that uses sound waves to create images of your heart. It provides your doctor with information about the size and shape of your heart and how well your heart's chambers and valves are working. This procedure takes approximately one hour. There are no restrictions for this procedure.    Follow-Up: At [facility], you and your health needs are our priority.  As part of our continuing mission to provide you with exceptional heart care, we have created designated  Provider Care Teams.  These Care Teams include your primary Cardiologist (physician) and Advanced Practice Providers (APPs -  Physician Assistants and Nurse Practitioners) who all work together to provide you with the care you need, when you need it.  Your next appointment:  After the echo   The format for your next appointment:   In Person  Provider:    You may see Debbe Odea, MD or one of the following Advanced Practice Providers on your designated Care Team:    Nicolasa Ducking, NP  Eula Listen, PA-C  Marisue Ivan, PA-C   Other Instructions  Echocardiogram An echocardiogram is a procedure that uses painless sound waves (ultrasound) to produce an image of the heart. Images from an echocardiogram can provide important information about:  Signs of coronary artery disease (CAD).  Aneurysm detection. An aneurysm is a weak or damaged part of an artery wall that bulges out from the normal force of blood pumping through the body.  Heart size and shape. Changes in the size or shape of the heart can be associated with certain conditions, including heart failure, aneurysm, and CAD.  Heart muscle function.  Heart valve function.  Signs of a past heart attack.  Fluid buildup around the heart.  Thickening of the heart muscle.  A tumor or infectious growth around the heart valves. Tell a health care provider about:  Any allergies you have.  All medicines you are taking, including vitamins, herbs, eye drops, creams, and over-the-counter medicines.  Any blood disorders you have.  Any surgeries you have had.  Any medical conditions you have.  Whether you are pregnant or may be pregnant. What are the risks? Generally, this is a safe procedure. However, problems may occur, including:  Allergic reaction to dye (contrast) that may be used during the procedure. What happens before the procedure? No specific preparation is needed. You may eat and drink normally. What  happens during the procedure?   An IV tube may be inserted into one of your veins.  You may receive contrast through this tube. A contrast is an injection that improves the quality of the pictures from your heart.  A gel will be applied to your chest.  A wand-like tool (transducer) will be moved over your chest. The gel will help to transmit the sound waves from the transducer.  The sound waves will harmlessly bounce off of your heart to allow the heart images to be captured in real-time motion. The images will be recorded on a computer. The procedure may vary among health care providers and hospitals. What happens after the procedure?  You may return to your normal, everyday life, including diet, activities, and medicines, unless your health care provider tells you not to do that. Summary  An echocardiogram is a procedure that uses painless sound waves (ultrasound) to produce an image of the heart.  Images from an echocardiogram can provide important information about the size and shape of your heart, heart muscle function, heart valve function, and fluid buildup around your heart.  You do not need to do anything to prepare before this procedure. You may eat and drink normally.  After the echocardiogram is completed, you may return to your normal, everyday life, unless your health care provider tells you not to do that. This information is not intended to replace advice given to you by your health care provider. Make sure you discuss any questions you have with your health care provider. Document Revised: 06/17/2018 Document Reviewed: 03/29/2016 Elsevier Patient Education  2020 [employer].      Signed, Debbe Odea, MD  04/22/2019 5:19 PM    [facility]

## 2019-04-22 NOTE — Patient Instructions (Signed)
Medication Instructions:  Your physician recommends that you continue on your current medications as directed. Please refer to the Current Medication list given to you today.  *If you need a refill on your cardiac medications before your next appointment, please call your pharmacy*  Lab Work: None ordered If you have labs (blood work) drawn today and your tests are completely normal, you will receive your results only by: . MyChart Message (if you have MyChart) OR . A paper copy in the mail If you have any lab test that is abnormal or we need to change your treatment, we will call you to review the results.  Testing/Procedures: Your physician has requested that you have an echocardiogram. Echocardiography is a painless test that uses sound waves to create images of your heart. It provides your doctor with information about the size and shape of your heart and how well your heart's chambers and valves are working. This procedure takes approximately one hour. There are no restrictions for this procedure.    Follow-Up: At [facility], you and your health needs are our priority.  As part of our continuing mission to provide you with exceptional heart care, we have created designated Provider Care Teams.  These Care Teams include your primary Cardiologist (physician) and Advanced Practice Providers (APPs -  Physician Assistants and Nurse Practitioners) who all work together to provide you with the care you need, when you need it.  Your next appointment:   After the echo   The format for your next appointment:   In Person  Provider:    You may see Brian Agbor-Etang, MD or one of the following Advanced Practice Providers on your designated Care Team:    Christopher Berge, NP  Ryan Dunn, PA-C  Jacquelyn Visser, PA-C   Other Instructions   Echocardiogram An echocardiogram is a procedure that uses painless sound waves (ultrasound) to produce an image of the heart. Images from an  echocardiogram can provide important information about:  Signs of coronary artery disease (CAD).  Aneurysm detection. An aneurysm is a weak or damaged part of an artery wall that bulges out from the normal force of blood pumping through the body.  Heart size and shape. Changes in the size or shape of the heart can be associated with certain conditions, including heart failure, aneurysm, and CAD.  Heart muscle function.  Heart valve function.  Signs of a past heart attack.  Fluid buildup around the heart.  Thickening of the heart muscle.  A tumor or infectious growth around the heart valves. Tell a health care provider about:  Any allergies you have.  All medicines you are taking, including vitamins, herbs, eye drops, creams, and over-the-counter medicines.  Any blood disorders you have.  Any surgeries you have had.  Any medical conditions you have.  Whether you are pregnant or may be pregnant. What are the risks? Generally, this is a safe procedure. However, problems may occur, including:  Allergic reaction to dye (contrast) that may be used during the procedure. What happens before the procedure? No specific preparation is needed. You may eat and drink normally. What happens during the procedure?   An IV tube may be inserted into one of your veins.  You may receive contrast through this tube. A contrast is an injection that improves the quality of the pictures from your heart.  A gel will be applied to your chest.  A wand-like tool (transducer) will be moved over your chest. The gel will help to   transmit the sound waves from the transducer.  The sound waves will harmlessly bounce off of your heart to allow the heart images to be captured in real-time motion. The images will be recorded on a computer. The procedure may vary among health care providers and hospitals. What happens after the procedure?  You may return to your normal, everyday life, including diet,  activities, and medicines, unless your health care provider tells you not to do that. Summary  An echocardiogram is a procedure that uses painless sound waves (ultrasound) to produce an image of the heart.  Images from an echocardiogram can provide important information about the size and shape of your heart, heart muscle function, heart valve function, and fluid buildup around your heart.  You do not need to do anything to prepare before this procedure. You may eat and drink normally.  After the echocardiogram is completed, you may return to your normal, everyday life, unless your health care provider tells you not to do that. This information is not intended to replace advice given to you by your health care provider. Make sure you discuss any questions you have with your health care provider. Document Revised: 06/17/2018 Document Reviewed: 03/29/2016 Elsevier Patient Education  2020 [employer]   

## 2019-06-03 ENCOUNTER — Other Ambulatory Visit: Payer: Medicaid Other

## 2019-06-10 ENCOUNTER — Ambulatory Visit: Payer: Medicaid Other | Admitting: Cardiology

## 2019-07-14 ENCOUNTER — Other Ambulatory Visit: Payer: Self-pay

## 2019-07-14 ENCOUNTER — Ambulatory Visit (INDEPENDENT_AMBULATORY_CARE_PROVIDER_SITE_OTHER): Payer: Medicaid Other

## 2019-07-14 DIAGNOSIS — R0600 Dyspnea, unspecified: Secondary | ICD-10-CM | POA: Diagnosis not present

## 2019-07-14 DIAGNOSIS — R079 Chest pain, unspecified: Secondary | ICD-10-CM

## 2019-07-14 MED ORDER — PERFLUTREN LIPID MICROSPHERE
1.0000 mL | INTRAVENOUS | Status: AC | PRN
  Administered 2019-07-14: 2 mL via INTRAVENOUS

## 2019-07-18 ENCOUNTER — Ambulatory Visit (INDEPENDENT_AMBULATORY_CARE_PROVIDER_SITE_OTHER): Payer: Medicaid Other | Admitting: Cardiology

## 2019-07-18 ENCOUNTER — Encounter: Payer: Self-pay | Admitting: Cardiology

## 2019-07-18 ENCOUNTER — Other Ambulatory Visit: Payer: Self-pay

## 2019-07-18 VITALS — BP 126/86 | HR 116 | Ht <= 58 in | Wt 394.0 lb

## 2019-07-18 DIAGNOSIS — R600 Localized edema: Secondary | ICD-10-CM

## 2019-07-18 DIAGNOSIS — R0600 Dyspnea, unspecified: Secondary | ICD-10-CM | POA: Diagnosis not present

## 2019-07-18 MED ORDER — TORSEMIDE 20 MG PO TABS: 40.0000 mg | ORAL_TABLET | Freq: Every day | ORAL | 2 refills | Status: DC

## 2019-07-18 NOTE — Progress Notes (Signed)
Cardiology Office Note:    Date:  07/18/2019   ID:  Kaitlyn Taylor, DOB 07-18-1965, MRN [id number]  PCP:  [facility]  Cardiologist:  Debbe Odea, MD  Electrophysiologist:  None   Referring MD: [facility], [facility]*   Chief Complaint  Patient presents with  . Other    follow up post ECHO. Meds reviewed verbally with patient.     History of Present Illness:    Kaitlyn Taylor is a 54 y.o. female with a hx of hypertension, diabetes, morbid obesity, obesity hypoventilatory syndrome, respiratory failure who presents for follow-up.  She was last seen due to lower extremity edema and shortness of breath with exertion.  Echocardiogram was ordered to evaluate diastolic function if possible.  Dyspnea on exertion deemed likely from morbid obesity with BMI of over 80.  She now presents for echo results.  Historical notes Patient states having shortness of breath with minimal exertion.  She needs help with activities of daily living due to obesity.  She was admitted to the hospital last summer, 8 months ago due to respiratory failure.  She uses BiPAP at home.  She denies ever smoking but states being around secondhand smoke most of her life due to her mother smoking.  She has noticed lower extremity edema and was started on Lasix 40 mg daily.  She used to be on blood pressure medications for hypertension, but since her recent hospitalization for respiratory failure, her blood pressure has been normal off of blood pressure meds.  He only takes Lasix.  She states having chest discomfort usually when her 9-month-old granddaughter is put on her chest.  Pressing on her chest causing some chest discomfort.  Patient had a transthoracic echocardiogram on 09/2018 which showed normal ejection fraction with EF 60 to 65%.  Diastolic function could not be performed.  Moderate asymmetric septal hypertrophy was noted.  Past Medical History:  Diagnosis Date  . Acute respiratory  failure with hypoxia (HCC)   . Back injury   . 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 Carpal tunnel syndrome   . CHF (congestive heart failure) (HCC)   . Common migraine   . COPD (chronic obstructive pulmonary disease) (HCC)   . Decreased hearing   . Depression   . DM (diabetes mellitus), gestational, delivered   . GERD (gastroesophageal reflux disease)   . Heart murmur   . Hyperlipidemia   . Hypertension   . Hypothyroid   . Memory loss   . Obesity   . Osteoporosis   . Sleep apnea   . Vitamin D deficiency     Past Surgical History:  Procedure Laterality Date  . ABDOMINAL HYSTERECTOMY  1995  . APPENDECTOMY    . CESAREAN SECTION     x 2     Current Medications: Current Meds  Medication Sig  . albuterol (VENTOLIN HFA) 108 (90 Base) MCG/ACT inhaler Inhale into the lungs.  . cetirizine (ZYRTEC) 10 MG tablet Take 10 mg by mouth at bedtime.  . cyclobenzaprine (FLEXERIL) 10 MG tablet Take 10 mg by mouth at bedtime as needed for muscle spasms.  Marland Kitchen ibuprofen (ADVIL) 600 MG tablet Take by mouth.  Marland Kitchen ketoconazole (NIZORAL) 2 % cream APPLY TO AFFECTED AREA TWICE A DAY FOR 2 WEEKS  . levothyroxine (SYNTHROID) 50 MCG tablet Take 50 mcg by mouth daily before breakfast.  . omeprazole (PRILOSEC) 20 MG capsule Take 20 mg by mouth daily.  Marland Kitchen  traZODone (DESYREL) 100 MG tablet Take by mouth.  . Vitamin D, Ergocalciferol, (DRISDOL) 1.25 MG (50000 UNIT) CAPS capsule Take 50,000 Units by mouth every 7 (seven) days.  . [DISCONTINUED] furosemide (LASIX) 40 MG tablet Take 40 mg by mouth daily.      Allergies:   Aspirin, Other, and Sulfa antibiotics   Social History   Socioeconomic History  . Marital status: Married    Spouse name: Not on file  . Number of children: Not on file  . Years of education: Not on file  . Highest education level: Not on file  Occupational History  . Not on file  Tobacco Use  . Smoking status: Never Smoker  . Smokeless tobacco: Never Used    Substance and Sexual Activity  . Alcohol use: Not Currently  . Drug use: Never  . Sexual activity: Not on file  Other Topics Concern  . Not on file  Social History Narrative  . Not on file   Social Determinants of Health   Financial Resource Strain:   . Difficulty of Paying Living Expenses:   Food Insecurity:   . Worried About Programme researcher, broadcasting/film/video in the Last Year:   . Barista in the Last Year:   Transportation Needs:   . Freight forwarder (Medical):   Marland Kitchen Lack of Transportation (Non-Medical):   Physical Activity:   . Days of Exercise per Week:   . Minutes of Exercise per Session:   Stress:   . Feeling of Stress :   Social Connections:   . Frequency of Communication with Friends and Family:   . Frequency of Social Gatherings with Friends and Family:   . Attends Religious Services:   . Active Member of Clubs or Organizations:   . Attends Banker Meetings:   Marland Kitchen Marital Status:      Family History: The patient's family history includes Breast cancer in her maternal grandmother, mother, and paternal grandmother; Heart attack in her father and paternal grandfather.  ROS:   Please see the history of present illness.     All other systems reviewed and are negative.  EKGs/Labs/Other Studies Reviewed:    The following studies were reviewed today:   EKG:  EKG not ordered today  Recent Labs: 09/14/2018: ALT 14; B Natriuretic Peptide 99.0 09/16/2018: BUN 21; Creatinine, Ser 0.75; Hemoglobin 10.7; Magnesium 1.8; Platelets 285; Potassium 4.6; Sodium 139  Recent Lipid Panel No results found for: CHOL, TRIG, HDL, CHOLHDL, VLDL, LDLCALC, LDLDIRECT  Physical Exam:    VS:  BP 126/86 (BP Location: Right Wrist, Patient Position: Sitting, Cuff Size: Normal)   Pulse (!) 116   Ht 4' 9.5" (1.461 m)   Wt (!) 394 lb (178.7 kg)   SpO2 98%   BMI 83.78 kg/m     Wt Readings from Last 3 Encounters:  07/18/19 (!) 394 lb (178.7 kg)  04/22/19 (!) 394 lb (178.7 kg)   09/14/18 (!) 425 lb 4.3 oz (192.9 kg)     GEN:  Well nourished, well developed in no acute distress, morbidly obese, has oxygen per nasal cannula via oxygen tank. HEENT: Normal NECK: No JVD; No carotid bruits LYMPHATICS: No lymphadenopathy CARDIAC: RRR, no murmurs, rubs, gallops RESPIRATORY: Decreased breath sounds, poor inspiratory effort ABDOMEN: Soft, non-tender, distended MUSCULOSKELETAL: .  1 to 2+ edema noted in the lower extremity SKIN: Warm and dry NEUROLOGIC:  Alert and oriented x 3 PSYCHIATRIC:  Normal affect   ASSESSMENT:    1. Dyspnea, unspecified  type   2. Morbid obesity ([place])   3. Lower extremity edema    PLAN:    In order of problems listed above:   1. Patient with nonspecific dyspnea, typically worse when she tries to exert herself.  Echocardiogram showed normal systolic function, EF 60 to 65%, technically difficult study, diastolic function could not be evaluated due to patient body habitus.  Her symptoms of dyspnea are likely secondary to morbidly obese, sleep apnea, obesity hypoventilatory syndrome. 2. Patient is morbidly obese.  BMI over 82.  Weight loss is of paramount importance.  Will refer patient to nutritionist for additional input. 3. Lower extremity edema .  Lymphedema versus possible diastolic dysfunction.  Start torsemide 40 daily due to better absorption in light of severe obesity.  Patient will be referred to lymphedema clinic for additional input.  Follow-up in 6 months.  This note was generated in part or whole with voice recognition software. Voice recognition is usually quite accurate but there are transcription errors that can and very often do occur. I apologize for any typographical errors that were not detected and corrected.  Medication Adjustments/Labs and Tests Ordered: Current medicines are reviewed at length with the patient today.  Concerns regarding medicines are outlined above.  Orders Placed This Encounter  Procedures  .  Ambulatory referral to Nutrition and Diabetic Education  . Ambulatory referral to Vascular Surgery   Meds ordered this encounter  Medications  . torsemide (DEMADEX) 20 MG tablet    Sig: Take 2 tablets (40 mg total) by mouth daily.    Dispense:  180 tablet    Refill:  2    Patient Instructions  Medication Instructions:  Your physician has recommended you make the following change in your medication:  1- STOP Furosemide. 2- START Torsemide 40 mg  (2 tablets) by mouth once a day.  *If you need a refill on your cardiac medications before your next appointment, please call your pharmacy*   Lab Work: none If you have labs (blood work) drawn today and your tests are completely normal, you will receive your results only by: Marland Kitchen MyChart Message (if you have MyChart) OR . A paper copy in the mail If you have any lab test that is abnormal or we need to change your treatment, we will call you to review the results.   Testing/Procedures: none   Follow-Up: You have been referred to [facility] for evaluation of Lymphedema.  Someone should call you to schedule. If not call [phone].   You have been referred to [place] and Diabetes Education Services at [facility]. Please call [phone] to set up an appointment.   At [facility], you and your health needs are our priority.  As part of our continuing mission to provide you with exceptional heart care, we have created designated Provider Care Teams.  These Care Teams include your primary Cardiologist (physician) and Advanced Practice Providers (APPs -  Physician Assistants and Nurse Practitioners) who all work together to provide you with the care you need, when you need it.  We recommend signing up for the patient portal called "MyChart".  Sign up information is provided on this After Visit Summary.  MyChart is used to connect with patients for Virtual Visits (Telemedicine).  Patients are able to  view lab/test results, encounter notes, upcoming appointments, etc.  Non-urgent messages can be sent to your provider as well.   To learn more about what you can do with MyChart, go to [email or web address].  Your next appointment:   6 month(s)  The format for your next appointment:   In Person  Provider:   Debbe Odea, MD     Signed, Debbe Odea, MD  07/18/2019 4:50 PM    [facility]

## 2019-07-18 NOTE — Patient Instructions (Signed)
Medication Instructions:  Your physician has recommended you make the following change in your medication:  1- STOP Furosemide. 2- START Torsemide 40 mg  (2 tablets) by mouth once a day.  *If you need a refill on your cardiac medications before your next appointment, please call your pharmacy*   Lab Work: none If you have labs (blood work) drawn today and your tests are completely normal, you will receive your results only by: Marland Kitchen MyChart Message (if you have MyChart) OR . A paper copy in the mail If you have any lab test that is abnormal or we need to change your treatment, we will call you to review the results.   Testing/Procedures: none   Follow-Up: You have been referred to [facility] for evaluation of Lymphedema.  Someone should call you to schedule. If not call [phone].   You have been referred to [facility] Nutrition and Diabetes Education Services at [facility]. Please call [phone] to set up an appointment.   At [facility], you and your health needs are our priority.  As part of our continuing mission to provide you with exceptional heart care, we have created designated Provider Care Teams.  These Care Teams include your primary Cardiologist (physician) and Advanced Practice Providers (APPs -  Physician Assistants and Nurse Practitioners) who all work together to provide you with the care you need, when you need it.  We recommend signing up for the patient portal called "MyChart".  Sign up information is provided on this After Visit Summary.  MyChart is used to connect with patients for Virtual Visits (Telemedicine).  Patients are able to view lab/test results, encounter notes, upcoming appointments, etc.  Non-urgent messages can be sent to your provider as well.   To learn more about what you can do with MyChart, go to [email or web address].    Your next appointment:   6 month(s)  The format for your next appointment:   In  Person  Provider:   Debbe Odea, MD

## 2019-07-28 ENCOUNTER — Ambulatory Visit: Payer: Medicaid Other | Admitting: Dietician

## 2019-08-02 ENCOUNTER — Other Ambulatory Visit: Payer: Self-pay

## 2019-08-02 ENCOUNTER — Ambulatory Visit (INDEPENDENT_AMBULATORY_CARE_PROVIDER_SITE_OTHER): Payer: Medicaid Other | Admitting: Vascular Surgery

## 2019-08-02 DIAGNOSIS — I10 Essential (primary) hypertension: Secondary | ICD-10-CM | POA: Diagnosis not present

## 2019-08-02 DIAGNOSIS — M7989 Other specified soft tissue disorders: Secondary | ICD-10-CM | POA: Diagnosis not present

## 2019-08-02 NOTE — Progress Notes (Signed)
Patient ID: Kaitlyn Taylor, female   DOB: 05-01-1965, 54 y.o.   MRN: [id number]  Chief Complaint  Patient presents with  . New Patient (Initial Visit)    LE edema    HPI Kaitlyn Taylor is a 54 y.o. female.  I am asked to see the patient by Dr. Azucena Cecil for evaluation of leg swelling.  The patient and her husband reports she has had leg swelling for many years that has gradually progressed over time.  She has developed ulcers on the top of her calf where her thigh hangs down and puts pressure on the calf.  She is morbidly obese and has become nonambulatory over the last couple of years.  She gets around in a motorized scooter.  She has had a previous trauma to her left leg but not 1 that required surgery.  No history of DVT or thrombophlebitis to her knowledge.  The swelling has been gradual in its progression but has become quite severe.  She cannot get compression stockings on.  She has very severe skin sensitivity and even touching her feet causes her to jump.  The left leg is the more severely affected of the 2 legs but she has a significant amount of swelling on the right as well.     Past Medical History:  Diagnosis Date  . Acute respiratory failure with hypoxia (HCC)   . Back injury   . 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 Carpal tunnel syndrome   . CHF (congestive heart failure) (HCC)   . Common migraine   . COPD (chronic obstructive pulmonary disease) (HCC)   . Decreased hearing   . Depression   . DM (diabetes mellitus), gestational, delivered   . GERD (gastroesophageal reflux disease)   . Heart murmur   . Hyperlipidemia   . Hypertension   . Hypothyroid   . Memory loss   . Obesity   . Osteoporosis   . Sleep apnea   . Vitamin D deficiency     Past Surgical History:  Procedure Laterality Date  . ABDOMINAL HYSTERECTOMY  1995  . APPENDECTOMY    . CESAREAN SECTION     x 2      Family History  Problem Relation Age of Onset  .  Breast cancer Mother   . Breast cancer Maternal Grandmother   . Breast cancer Paternal Grandmother   . Heart attack Father   . Heart attack Paternal Grandfather      Social History   Tobacco Use  . Smoking status: Never Smoker  . Smokeless tobacco: Never Used  Substance Use Topics  . Alcohol use: Not Currently  . Drug use: Never    Allergies  Allergen Reactions  . Aspirin Swelling  . Other Swelling  . Sulfa Antibiotics     Current Outpatient Medications  Medication Sig Dispense Refill  . albuterol (VENTOLIN HFA) 108 (90 Base) MCG/ACT inhaler Inhale into the lungs.    . cetirizine (ZYRTEC) 10 MG tablet Take 10 mg by mouth at bedtime.    . cyclobenzaprine (FLEXERIL) 10 MG tablet Take 10 mg by mouth at bedtime as needed for muscle spasms.    Marland Kitchen ibuprofen (ADVIL) 600 MG tablet Take by mouth.    . levothyroxine (SYNTHROID) 50 MCG tablet Take 50 mcg by mouth daily before breakfast.    . omeprazole (PRILOSEC) 20 MG capsule Take 20 mg by mouth daily.    . traZODone (  DESYREL) 100 MG tablet Take by mouth.    . Vitamin D, Ergocalciferol, (DRISDOL) 1.25 MG (50000 UNIT) CAPS capsule Take 50,000 Units by mouth every 7 (seven) days.    Marland Kitchen ketoconazole (NIZORAL) 2 % cream APPLY TO AFFECTED AREA TWICE A DAY FOR 2 WEEKS    . torsemide (DEMADEX) 20 MG tablet Take 2 tablets (40 mg total) by mouth daily. (Patient not taking: Reported on 08/02/2019) 180 tablet 2   No current facility-administered medications for this visit.      REVIEW OF SYSTEMS (Negative unless checked)  Constitutional: [] Weight loss  [] Fever  [] Chills Cardiac: [] Chest pain   [] Chest pressure   [] Palpitations   [] Shortness of breath when laying flat   [x] Shortness of breath at rest   [x] Shortness of breath with exertion. Vascular:  [] Pain in legs with walking   [] Pain in legs at rest   [] Pain in legs when laying flat   [] Claudication   [] Pain in feet when walking  [] Pain in feet at rest  [] Pain in feet when laying flat    [] History of DVT   [] Phlebitis   [x] Swelling in legs   [] Varicose veins   [] Non-healing ulcers Pulmonary:   [] Uses home oxygen   [] Productive cough   [] Hemoptysis   [] Wheeze  [] COPD   [] Asthma Neurologic:  [] Dizziness  [] Blackouts   [] Seizures   [] History of stroke   [] History of TIA  [] Aphasia   [] Temporary blindness   [] Dysphagia   [] Weakness or numbness in arms   [] Weakness or numbness in legs Musculoskeletal:  [x] Arthritis   [] Joint swelling   [] Joint pain   [] Low back pain Hematologic:  [] Easy bruising  [] Easy bleeding   [] Hypercoagulable state   [] Anemic  [] Hepatitis Gastrointestinal:  [] Blood in stool   [] Vomiting blood  [] Gastroesophageal reflux/heartburn   [] Abdominal pain Genitourinary:  [] Chronic kidney disease   [] Difficult urination  [] Frequent urination  [] Burning with urination   [] Hematuria Skin:  [] Rashes   [] Ulcers   [] Wounds Psychological:  [] History of anxiety   []  History of major depression.    Physical Exam BP 136/79   Pulse (!) 117   Ht 4\' 3"  (1.295 m)   Wt (!) 394 lb (178.7 kg)   BMI 106.50 kg/m  Gen:  WD/WN, NAD.  Morbidly obese Head: [place]/AT, No temporalis wasting.  Ear/Nose/Throat: Hearing grossly intact, nares w/o erythema or drainage, oropharynx w/o Erythema/Exudate Eyes: Conjunctiva clear, sclera non-icteric  Neck: trachea midline.  No JVD.  Pulmonary:  Good air movement, respirations not labored, no use of accessory muscles  Cardiac: RRR, no JVD Vascular:  Vessel Right Left  Radial Palpable Palpable                          DP  1+ palpable  1+ palpable  PT  not palpable  not palpable   Gastrointestinal:. No masses, surgical incisions, or scars. Musculoskeletal: M/S 5/5 throughout.  Extremities without ischemic changes.  No deformity or atrophy.  2+ right lower extremity edema, 3+ left lower extremity edema.  The swelling extends well into the thighs where the swelling is more severe.  She essentially has a pannus hanging down from her thigh down to  her upper calf Neurologic: Sensation grossly intact in extremities.  Symmetrical.  Speech is fluent. Motor exam as listed above. Psychiatric: Judgment intact, Mood & affect appropriate for pt's clinical situation. Dermatologic: No rashes or ulcers noted.  No cellulitis or open wounds.    Radiology  ECHOCARDIOGRAM COMPLETE  Result Date: 07/14/2019    ECHOCARDIOGRAM REPORT   Patient Name:   Kaitlyn Taylor Date of Exam: 07/14/2019 Medical Rec #:  [id number]          Height:       57.5 in Accession #:    [id number]         Weight:       394.0 lb Date of Birth:  Jun 07, 1965          BSA:          2.415 m Patient Age:    63 years           BP:           124/82 mmHg Patient Gender: F                  HR:           117 bpm. Exam Location:  [place] Procedure: 2D Echo, Cardiac Doppler, Color Doppler and Intracardiac            Opacification Agent Indications:    R07.9* Chest pain, unspecified; R06.02 SOB  History:        Patient has prior history of Echocardiogram examinations, most                 recent 09/15/2018. CHF, COPD, Signs/Symptoms:Shortness of Breath,                 Chest Pain and Murmur; Risk Factors:Non-Smoker, Dyslipidemia and                 Hypertension.  Sonographer:    Pilar Jarvis RDMS, RVT, RDCS Referring Phys: [id number] [facility]  Sonographer Comments: Patient is morbidly obese. Image acquisition challenging due to patient body habitus. Very limited exam with Definity IMPRESSIONS  1. Left ventricular ejection fraction, by estimation, is 60 to 65%. The left ventricle has normal function. The left ventricle has no regional wall motion abnormalities. Left ventricular diastolic function could not be evaluated.  2. Right ventricular systolic function is normal. The right ventricular size is not well visualized.  3. The mitral valve was not assessed. not assessed mitral valve regurgitation.  4. Tricuspid valve regurgitation is moderate.  5. The aortic valve was not assessed. Aortic valve  regurgitation not assessed.  6. Pulmonic valve regurgitation not assessed. FINDINGS  Left Ventricle: Left ventricular ejection fraction, by estimation, is 60 to 65%. The left ventricle has normal function. The left ventricle has no regional wall motion abnormalities. Definity contrast agent was given IV to delineate the left ventricular  endocardial borders. The left ventricular internal cavity size was normal in size. There is no left ventricular hypertrophy. Left ventricular diastolic function could not be evaluated. Right Ventricle: The right ventricular size is not well visualized. Right vetricular wall thickness was not assessed. Right ventricular systolic function is normal. Left Atrium: Left atrial size was not well visualized. Right Atrium: Right atrial size was not well visualized. Pericardium: There is no evidence of pericardial effusion. Mitral Valve: The mitral valve was not assessed. Not assessed mitral valve regurgitation. Tricuspid Valve: The tricuspid valve is not assessed. Tricuspid valve regurgitation is moderate. Aortic Valve: The aortic valve was not assessed. Aortic valve regurgitation not assessed. Pulmonic Valve: The pulmonic valve was not assessed. Pulmonic valve regurgitation not assessed. Aorta: The aortic root was not well visualized. Venous: The inferior vena cava was not well visualized. IAS/Shunts: The interatrial septum was not well visualized.  TRICUSPID VALVE  TR Peak grad:   6.7 mmHg TR Vmax:        129.00 cm/s Debbe Odea MD Electronically signed by Debbe Odea MD Signature Date/Time: 07/14/2019/6:32:33 PM    Final     Labs No results found for this or any previous visit (from the past 2160 hour(s)).  Assessment/Plan:  Morbid obesity (HCC) This is one of the primary cause of her lower extremity swelling and without weight loss getting her swelling under control is going to be difficult or impossible.  Essential hypertension blood pressure control important in  reducing the progression of atherosclerotic disease. On appropriate oral medications.   Swelling of limb I have had a long discussion with the patient regarding swelling and why it  causes symptoms.  Patient will begin wearing the Velcro compression system on a daily basis a prescription was given. The patient will  beginning wearing the impression system first thing in the morning and removing them in the evening. The patient is instructed specifically not to sleep in the stockings.  There is no way she can get compression stockings on reasonably.  In addition, behavioral modification will be initiated.  This will include frequent elevation, use of over the counter pain medications and exercise such as walking.  Exercise going to be extremely difficult and with her excessive weight, I am not expecting dramatic improvement in her swelling going forward.  I have reviewed systemic causes for chronic edema such as liver, kidney and cardiac etiologies.  The patient denies problems with these organ systems.    Consideration for a lymph pump will also be made based upon the effectiveness of conservative therapy.  This would help to improve the edema control and prevent sequela such as ulcers and infections.  She has clearly developed severe lymphedema from long-term swelling in her size.  This would be at least stage II lymphedema at this point.  Patient should undergo duplex ultrasound of the venous system to ensure that DVT or reflux is not present.  The patient will follow-up with me after the ultrasound.        Festus Barren 08/02/2019, 5:14 PM   This note was created with Dragon medical transcription system.  Any errors from dictation are unintentional.

## 2019-08-02 NOTE — Assessment & Plan Note (Signed)
This is one of the primary cause of her lower extremity swelling and without weight loss getting her swelling under control is going to be difficult or impossible.

## 2019-08-02 NOTE — Patient Instructions (Signed)

## 2019-08-02 NOTE — Assessment & Plan Note (Signed)
blood pressure control important in reducing the progression of atherosclerotic disease. On appropriate oral medications.  

## 2019-08-02 NOTE — Assessment & Plan Note (Signed)
I have had a long discussion with the patient regarding swelling and why it  causes symptoms.  Patient will begin wearing the Velcro compression system on a daily basis a prescription was given. The patient will  beginning wearing the impression system first thing in the morning and removing them in the evening. The patient is instructed specifically not to sleep in the stockings.  There is no way she can get compression stockings on reasonably.  In addition, behavioral modification will be initiated.  This will include frequent elevation, use of over the counter pain medications and exercise such as walking.  Exercise going to be extremely difficult and with her excessive weight, I am not expecting dramatic improvement in her swelling going forward.  I have reviewed systemic causes for chronic edema such as liver, kidney and cardiac etiologies.  The patient denies problems with these organ systems.    Consideration for a lymph pump will also be made based upon the effectiveness of conservative therapy.  This would help to improve the edema control and prevent sequela such as ulcers and infections.  She has clearly developed severe lymphedema from long-term swelling in her size.  This would be at least stage II lymphedema at this point.  Patient should undergo duplex ultrasound of the venous system to ensure that DVT or reflux is not present.  The patient will follow-up with me after the ultrasound.

## 2019-08-04 ENCOUNTER — Ambulatory Visit: Payer: Medicaid Other | Admitting: Dietician

## 2019-08-09 ENCOUNTER — Encounter (INDEPENDENT_AMBULATORY_CARE_PROVIDER_SITE_OTHER): Payer: Self-pay | Admitting: Nurse Practitioner

## 2019-08-09 ENCOUNTER — Ambulatory Visit (INDEPENDENT_AMBULATORY_CARE_PROVIDER_SITE_OTHER): Payer: Medicaid Other | Admitting: Nurse Practitioner

## 2019-08-09 ENCOUNTER — Other Ambulatory Visit: Payer: Self-pay

## 2019-08-09 ENCOUNTER — Encounter (INDEPENDENT_AMBULATORY_CARE_PROVIDER_SITE_OTHER): Payer: Self-pay

## 2019-08-09 ENCOUNTER — Ambulatory Visit (INDEPENDENT_AMBULATORY_CARE_PROVIDER_SITE_OTHER): Payer: Medicaid Other

## 2019-08-09 VITALS — BP 138/74 | HR 127 | Resp 18

## 2019-08-09 DIAGNOSIS — M79605 Pain in left leg: Secondary | ICD-10-CM

## 2019-08-09 DIAGNOSIS — I890 Lymphedema, not elsewhere classified: Secondary | ICD-10-CM | POA: Diagnosis not present

## 2019-08-09 DIAGNOSIS — M79604 Pain in right leg: Secondary | ICD-10-CM

## 2019-08-09 DIAGNOSIS — I10 Essential (primary) hypertension: Secondary | ICD-10-CM

## 2019-08-11 ENCOUNTER — Encounter (INDEPENDENT_AMBULATORY_CARE_PROVIDER_SITE_OTHER): Payer: Self-pay | Admitting: Nurse Practitioner
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
## 2019-09-16 ENCOUNTER — Ambulatory Visit: Payer: Medicaid Other | Admitting: Cardiology

## 2020-01-24 ENCOUNTER — Encounter: Payer: Self-pay | Admitting: Licensed Clinical Social Worker

## 2020-02-10 ENCOUNTER — Ambulatory Visit (INDEPENDENT_AMBULATORY_CARE_PROVIDER_SITE_OTHER): Payer: Medicaid Other | Admitting: Nurse Practitioner

## 2022-01-14 ENCOUNTER — Institutional Professional Consult (permissible substitution): Payer: Medicaid Other | Admitting: Pulmonary Disease

## 2022-01-22 ENCOUNTER — Ambulatory Visit (INDEPENDENT_AMBULATORY_CARE_PROVIDER_SITE_OTHER): Payer: Medicaid Other | Admitting: Student in an Organized Health Care Education/Training Program

## 2022-01-22 ENCOUNTER — Encounter: Payer: Self-pay | Admitting: Student in an Organized Health Care Education/Training Program

## 2022-01-22 VITALS — BP 144/82 | HR 107 | Temp 97.8°F | Ht <= 58 in | Wt 362.0 lb

## 2022-01-22 DIAGNOSIS — R0602 Shortness of breath: Secondary | ICD-10-CM

## 2022-01-22 NOTE — Progress Notes (Signed)
Synopsis: Referred in for OHS by Kaitlyn Sato, MD  Assessment & Plan:   1. Shortness of breath 2. Morbid obesity (HCC) 3. Obesity Hypoventilation Syndrome  She was diagnosed with presumed obesity hypoventilation syndrome after a hospitalization in 2020 where she was noted to have a significantly elevated bicarb/CO2 on blood work. Given the hypercapnia and her obesity, OHS is the most likely diagnosis and she's benefited from having a non-invasive ventilator at home. She is maintained on Trelegy with an AVAPS mode. I have reviewed her Trelegy settings and hours of usage showing compliance on AVAPS with a tidal volume of 430 and an EPAP of 8. She is compliant with it and uses it over 90% of the night. She triggers the AVAPS only 9% of the time suggesting significant OHS.  The patient is using her ventilator and very much benefitting from using it. Should AVAPS be discontinued, she would quickly develop hypercapnic respiratory failure and require hospitalization.  I did discuss with Kaitlyn Taylor that the definitive management for her is weight loss and encouraged her to loose weight. I have placed a referral to bariatric surgery for her.   - Amb Referral to Bariatric Surgery - Continue AVAPS ventilator - oxygen support during the day - weight loss   Return in about 1 year (around 01/23/2023).  I spent 49 minutes caring for this patient today, including preparing to see the patient, obtaining and/or reviewing separately obtained history, performing a medically appropriate examination and/or evaluation, counseling and educating the patient/family/caregiver, referring and communicating with other health care professionals (not separately reported), documenting clinical information in the electronic health record, and independently interpreting results (not separately reported/billed) and communicating results to the patient/family/caregiver  Kaitlyn Chute, MD [facility] 01/22/2022 6:22 PM    End of visit medications:  No orders of the defined types were placed in this encounter.    Current Outpatient Medications:    albuterol (VENTOLIN HFA) 108 (90 Base) MCG/ACT inhaler, Inhale into the lungs., Disp: , Rfl:    albuterol (VENTOLIN HFA) 108 (90 Base) MCG/ACT inhaler, 1 puff as needed Inhalation every 4 hrs, Disp: , Rfl:    atorvastatin (LIPITOR) 20 MG tablet, Take 20 mg by mouth daily., Disp: , Rfl:    cyclobenzaprine (FLEXERIL) 10 MG tablet, Take 10 mg by mouth at bedtime as needed for muscle spasms., Disp: , Rfl:    fexofenadine (ALLEGRA) 180 MG tablet, 1 tablet Swallow whole with water; do not take with fruit juices. Orally Once a day for 30 days, Disp: , Rfl:    fluticasone (FLOVENT HFA) 220 MCG/ACT inhaler, Inhale 1 puff into the lungs in the morning and at bedtime., Disp: , Rfl:    furosemide (LASIX) 40 MG tablet, Take 40 mg by mouth daily., Disp: , Rfl:    ibuprofen (ADVIL) 600 MG tablet, Take by mouth., Disp: , Rfl:    ibuprofen (ADVIL) 800 MG tablet, 1 tablet with food or milk as needed Orally every 8 hrs, Disp: , Rfl:    ipratropium-albuterol (DUONEB) 0.5-2.5 (3) MG/3ML SOLN, 3 mL as needed Inhalation every 6 hrs, Disp: , Rfl:    levothyroxine (SYNTHROID) 50 MCG tablet, Take 50 mcg by mouth daily before breakfast., Disp: , Rfl:    LEXAPRO 10 MG tablet, Take 10 mg by mouth daily., Disp: , Rfl:    NEXIUM 40 MG capsule, Take 40 mg by mouth daily at 12 noon., Disp: , Rfl:    nystatin (MYCOSTATIN/NYSTOP) powder, 1 application  Externally every 8 hrs for 90 days, Disp: , Rfl:    Vitamin D, Ergocalciferol, (DRISDOL) 1.25 MG (50000 UNIT) CAPS capsule, Take 50,000 Units by mouth every 7 (seven) days., Disp: , Rfl:    Subjective:   PATIENT ID: Kaitlyn Taylor GENDER: female DOB: 08/20/1965, MRN: [id number]  Chief Complaint  Patient presents with   Consult    HPI  Kaitlyn Taylor is a pleasant 56 year old female with a history of morbid obesity  who is presenting to establish care. She was previously seen by Dr. Karna Christmas at [facility] but has since been lost to follow up.  She was admitted to the hospital in 2020 where she had developed hypoxic and hypercapnic respiratory failure secondary to presumed obesity hypoventilation syndrome. She was discharged on a non-invasive ventilator and started nocturnal trelegy (AVAPS) with improvement. She has not had any complications with the Trelegy. She bleeds 3 liters of oxygen into the Trelegy overnight and uses oxygen via nasal cannula during the day. She had one exacerbation of her respiratory symptoms 2 years ago prompting an ED visit but has otherwise been stable. She is house bound secondary to morbid obesity and had been recommended bariatric surgery in the past by Dr. Karna Christmas. She reports that her shortness of breath has worsened over the past few years and she is short of breath with minimal exertion.  Ancillary information including prior medications, full medical/surgical/family/social histories, and PFTs (when available) are listed below and have been reviewed.   Review of Systems  Constitutional:  Negative for chills and fever.  Respiratory:  Positive for shortness of breath. Negative for cough, sputum production and wheezing.   Cardiovascular:  Negative for chest pain.  Skin:  Negative for rash.     Objective:   Vitals:   01/22/22 1613 01/22/22 1619  BP:  (!) 144/82  Pulse:  (!) 107  Temp:  97.8 F (36.6 C)  SpO2: 99% 99%  Weight:  (!) 362 lb (164.2 kg)  Height:  4' 9.5" (1.461 m)   99% on 2L nasal cannula  BMI Readings from Last 3 Encounters:  01/22/22 76.98 kg/m  08/02/19 106.50 kg/m  07/18/19 83.78 kg/m   Wt Readings from Last 3 Encounters:  01/22/22 (!) 362 lb (164.2 kg)  08/02/19 (!) 394 lb (178.7 kg)  07/18/19 (!) 394 lb (178.7 kg)    Physical Exam Constitutional:      Appearance: She is obese.     Comments: Morbidly obese, on an EMS  stretcher  HENT:     Head: Normocephalic.     Mouth/Throat:     Mouth: Mucous membranes are moist.     Comments: Poor dentition Neck:     Comments: Micrognathia Cardiovascular:     Rate and Rhythm: Regular rhythm. Tachycardia present.  Pulmonary:     Effort: Pulmonary effort is normal.     Breath sounds: Normal breath sounds.     Comments: On anterior auscultation, patient unable to sit up due to obesity Abdominal:     General: There is distension.     Palpations: Abdomen is soft.  Neurological:     General: No focal deficit present.     Mental Status: She is oriented to person, place, and time.     Ancillary Information    Past Medical History:  Diagnosis Date   Acute respiratory failure with hypoxia (HCC)    Back injury    Back pain    old injury at work in 2000 lifting  a patient    Carpal tunnel syndrome    CHF (congestive heart failure) (HCC)    Common migraine    COPD (chronic obstructive pulmonary disease) (HCC)    Decreased hearing    Depression    DM (diabetes mellitus), gestational, delivered    GERD (gastroesophageal reflux disease)    Heart murmur    Hyperlipidemia    Hypertension    Hypothyroid    Memory loss    Obesity    Osteoporosis    Sleep apnea    Vitamin D deficiency      Family History  Problem Relation Age of Onset   Breast cancer Mother    Breast cancer Maternal Grandmother    Breast cancer Paternal Grandmother    Heart attack Father    Heart attack Paternal Grandfather      Past Surgical History:  Procedure Laterality Date   ABDOMINAL HYSTERECTOMY  1995   APPENDECTOMY     CESAREAN SECTION     x 2     Social History   Socioeconomic History   Marital status: Married    Spouse name: Not on file   Number of children: Not on file   Years of education: Not on file   Highest education level: Not on file  Occupational History   Not on file  Tobacco Use   Smoking status: Never   Smokeless tobacco: Never  Vaping Use    Vaping Use: Never used  Substance and Sexual Activity   Alcohol use: Not Currently   Drug use: Never   Sexual activity: Not on file  Other Topics Concern   Not on file  Social History Narrative   Not on file   Social Determinants of Health   Financial Resource Strain: Not on file  Food Insecurity: Not on file  Transportation Needs: Not on file  Physical Activity: Not on file  Stress: Not on file  Social Connections: Not on file  Intimate Partner Violence: Not on file     Allergies  Allergen Reactions   Aspirin Swelling   Sulfa Antibiotics Swelling   Nystatin Hives    Cream only     CBC    Component Value Date/Time   WBC 10.3 09/16/2018 0404   RBC 4.02 09/16/2018 0404   HGB 10.7 (L) 09/16/2018 0404   HCT 37.2 09/16/2018 0404   PLT 285 09/16/2018 0404   MCV 92.5 09/16/2018 0404   MCH 26.6 09/16/2018 0404   MCHC 28.8 (L) 09/16/2018 0404   RDW 14.6 09/16/2018 0404   LYMPHSABS 1.3 09/14/2018 1418   MONOABS 0.6 09/14/2018 1418   EOSABS 0.3 09/14/2018 1418   BASOSABS 0.0 09/14/2018 1418    Pulmonary Functions Testing Results:     No data to display          Outpatient Medications Prior to Visit  Medication Sig Dispense Refill   albuterol (VENTOLIN HFA) 108 (90 Base) MCG/ACT inhaler Inhale into the lungs.     albuterol (VENTOLIN HFA) 108 (90 Base) MCG/ACT inhaler 1 puff as needed Inhalation every 4 hrs     atorvastatin (LIPITOR) 20 MG tablet Take 20 mg by mouth daily.     cyclobenzaprine (FLEXERIL) 10 MG tablet Take 10 mg by mouth at bedtime as needed for muscle spasms.     fexofenadine (ALLEGRA) 180 MG tablet 1 tablet Swallow whole with water; do not take with fruit juices. Orally Once a day for 30 days     fluticasone (FLOVENT HFA) 220 MCG/ACT  inhaler Inhale 1 puff into the lungs in the morning and at bedtime.     furosemide (LASIX) 40 MG tablet Take 40 mg by mouth daily.     ibuprofen (ADVIL) 600 MG tablet Take by mouth.     ibuprofen (ADVIL) 800 MG tablet  1 tablet with food or milk as needed Orally every 8 hrs     ipratropium-albuterol (DUONEB) 0.5-2.5 (3) MG/3ML SOLN 3 mL as needed Inhalation every 6 hrs     levothyroxine (SYNTHROID) 50 MCG tablet Take 50 mcg by mouth daily before breakfast.     LEXAPRO 10 MG tablet Take 10 mg by mouth daily.     NEXIUM 40 MG capsule Take 40 mg by mouth daily at 12 noon.     nystatin (MYCOSTATIN/NYSTOP) powder 1 application Externally every 8 hrs for 90 days     Vitamin D, Ergocalciferol, (DRISDOL) 1.25 MG (50000 UNIT) CAPS capsule Take 50,000 Units by mouth every 7 (seven) days.     cetirizine (ZYRTEC) 10 MG tablet Take 10 mg by mouth at bedtime.     ketoconazole (NIZORAL) 2 % cream APPLY TO AFFECTED AREA TWICE A DAY FOR 2 WEEKS     omeprazole (PRILOSEC) 20 MG capsule Take 20 mg by mouth daily.     torsemide (DEMADEX) 20 MG tablet Take 2 tablets (40 mg total) by mouth daily. (Patient not taking: Reported on 08/02/2019) 180 tablet 2   traZODone (DESYREL) 100 MG tablet Take by mouth.     No facility-administered medications prior to visit.

## 2022-01-28 ENCOUNTER — Telehealth: Payer: Self-pay | Admitting: Student in an Organized Health Care Education/Training Program

## 2022-01-28 NOTE — Telephone Encounter (Signed)
Vent form has been received and placed in Dr. Aundria Rud folder for signature. Carollee Herter with [employer] is aware that form will be faxed once signed. She is aware that Dr. Aundria Rud will not be back in office until 02/06/2022.

## 2022-01-28 NOTE — Telephone Encounter (Signed)
Signed and done 

## 2022-01-28 NOTE — Telephone Encounter (Signed)
Form has been signed and faxed to [employer].  Carollee Herter with [employer] is aware. Nothing further needed.

## 2022-03-20 ENCOUNTER — Telehealth: Payer: Self-pay | Admitting: Student in an Organized Health Care Education/Training Program

## 2022-03-20 NOTE — Telephone Encounter (Signed)
Dr. Genia Harold, please advise if virtual visit is okay? Thanks

## 2022-03-20 NOTE — Telephone Encounter (Signed)
Spoke to patient and scheduled mychart visit for 04/09/22 at 2:30. Nothing further needed.

## 2022-04-09 ENCOUNTER — Encounter: Payer: Self-pay | Admitting: Student in an Organized Health Care Education/Training Program

## 2022-04-09 ENCOUNTER — Telehealth (INDEPENDENT_AMBULATORY_CARE_PROVIDER_SITE_OTHER): Payer: Medicaid Other | Admitting: Student in an Organized Health Care Education/Training Program

## 2022-04-09 DIAGNOSIS — E662 Morbid (severe) obesity with alveolar hypoventilation: Secondary | ICD-10-CM

## 2022-04-09 NOTE — Progress Notes (Signed)
Synopsis: Tele-health visit  Assessment & Plan:   1. Morbid obesity ([place]) 2. Obesity hypoventilation syndrome ([place])  She was diagnosed with presumed obesity hypoventilation syndrome after a hospitalization in 2020 where she was noted to have a significantly elevated bicarb/CO2 on blood work. Given the hypercapnia and her obesity, OHS is the most likely diagnosis and she's benefited from having a non-invasive ventilator at home. She is maintained on Trelegy with an AVAPS mode. Her insurance provider is denying coverage and is requiring her to undergo sleep study and ABG testing, which I will order today.  I have reviewed her Trelegy settings during her last visit and hours of usage showing compliance on AVAPS with a tidal volume of 430 and an EPAP of 8. She is compliant with it and uses it over 90% of the night. She triggers the AVAPS only 9% of the time suggesting significant OHS.   The patient is using her ventilator and very much benefitting from using it. Should AVAPS be discontinued, she would quickly develop hypercapnic respiratory failure and require hospitalization.   I did discuss with Kaitlyn Taylor that the definitive management for her is weight loss and encouraged her to loose weight. I have placed a referral to bariatric surgery for her.  - Pulmonary Function Test [facility] Only; Future - Split night study; Future   No follow-ups on file.  I spent 25 minutes caring for this patient today, including preparing to see the patient, obtaining a medical history , reviewing a separately obtained history, counseling and educating the patient/family/caregiver, ordering medications, tests, or procedures, and documenting clinical information in the electronic health record  I connected with  Kaitlyn Taylor on 04/09/22 by a video enabled telemedicine application and verified that I am speaking with the correct person using two identifiers.   I discussed the limitations of evaluation and  management by telemedicine. The patient expressed understanding and agreed to proceed.   Armando Reichert, MD [name] 04/09/2022 6:08 PM    End of visit medications:  No orders of the defined types were placed in this encounter.    Current Outpatient Medications:    albuterol (VENTOLIN HFA) 108 (90 Base) MCG/ACT inhaler, 1 puff as needed Inhalation every 4 hrs, Disp: , Rfl:    atorvastatin (LIPITOR) 20 MG tablet, Take 20 mg by mouth daily., Disp: , Rfl:    cyclobenzaprine (FLEXERIL) 10 MG tablet, Take 10 mg by mouth at bedtime as needed for muscle spasms., Disp: , Rfl:    fexofenadine (ALLEGRA) 180 MG tablet, 1 tablet Swallow whole with water; do not take with fruit juices. Orally Once a day for 30 days, Disp: , Rfl:    fluticasone (FLOVENT HFA) 220 MCG/ACT inhaler, Inhale 1 puff into the lungs in the morning and at bedtime., Disp: , Rfl:    furosemide (LASIX) 40 MG tablet, Take 40 mg by mouth daily., Disp: , Rfl:    ibuprofen (ADVIL) 800 MG tablet, 1 tablet with food or milk as needed Orally every 8 hrs, Disp: , Rfl:    ipratropium-albuterol (DUONEB) 0.5-2.5 (3) MG/3ML SOLN, 3 mL as needed Inhalation every 6 hrs, Disp: , Rfl:    levothyroxine (SYNTHROID) 50 MCG tablet, Take 50 mcg by mouth daily before breakfast., Disp: , Rfl:    LEXAPRO 10 MG tablet, Take 10 mg by mouth daily., Disp: , Rfl:    lisinopril (ZESTRIL) 10 MG tablet, Take 10 mg by mouth daily., Disp: , Rfl:    NEXIUM 40  MG capsule, Take 40 mg by mouth daily at 12 noon., Disp: , Rfl:    nystatin (MYCOSTATIN/NYSTOP) powder, 1 application Externally every 8 hrs for 90 days, Disp: , Rfl:    Vitamin D, Ergocalciferol, (DRISDOL) 1.25 MG (50000 UNIT) CAPS capsule, Take 50,000 Units by mouth every 7 (seven) days., Disp: , Rfl:    albuterol (VENTOLIN HFA) 108 (90 Base) MCG/ACT inhaler, Inhale into the lungs. (Patient not taking: Reported on 04/09/2022), Disp: , Rfl:    ibuprofen (ADVIL) 600 MG tablet, Take by  mouth. (Patient not taking: Reported on 04/09/2022), Disp: , Rfl:    Subjective:   PATIENT ID: Kaitlyn Taylor GENDER: female DOB: [birth year], MRN: [id number]  Chief Complaint  Patient presents with   Follow-up    Congestion. Cough with yellow sputum. For couple of days. No fevers. Chills. SOB with exertion. Wheezing.     HPI  Kaitlyn Taylor is a pleasant 57 year old female with a history of morbid obesity who is presenting for a tele health visit. I had seen her for an initial visit on 01/22/2022. Patient is morbidly obese and is unable to present to clinic for a visit - she requested a tele-health visit instead.  She requested this visit to discuss her AVAPS. She was denied coverage for the AVAPS. She reports feeling stuffy and that she is coming down with a URTI. Her grandchild is sick with a cold and she thinks she might be experiencing the same. No fevers or chills reported, no increase in shortness of breath.  She was admitted to the hospital in 2020 where she had developed hypoxic and hypercapnic respiratory failure secondary to presumed obesity hypoventilation syndrome. She was discharged on a non-invasive ventilator and started nocturnal trelegy (AVAPS) with improvement. She has not had any complications with the Trelegy. She bleeds 3 liters of oxygen into the Trelegy overnight and uses oxygen via nasal cannula during the day. She had one exacerbation of her respiratory symptoms 2 years ago prompting an ED visit but has otherwise been stable. She is house bound secondary to morbid obesity and had been recommended bariatric surgery in the past by Dr. Lanney Gins.  Ancillary information including prior medications, full medical/surgical/family/social histories, and PFTs (when available) are listed below and have been reviewed.    Objective:  Unable to obtain vitals or perform physical exam     Ancillary Information    Past Medical History:  Diagnosis Date   Acute respiratory  failure with hypoxia ([place])    Back injury    Back pain    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    Carpal tunnel syndrome    CHF (congestive heart failure) ([facility])    Common migraine    COPD (chronic obstructive pulmonary disease) ([facility])    Decreased hearing    Depression    DM (diabetes mellitus), gestational, delivered    GERD (gastroesophageal reflux disease)    Heart murmur    Hyperlipidemia    Hypertension    Hypothyroid    Memory loss    Obesity    Osteoporosis    Sleep apnea    Vitamin D deficiency      Family History  Problem Relation Age of Onset   Breast cancer Mother    Breast cancer Maternal Grandmother    Breast cancer Paternal Grandmother    Heart attack Father    Heart attack Paternal Grandfather      Past Surgical History:  Procedure Laterality Date  ABDOMINAL HYSTERECTOMY  1995   APPENDECTOMY     CESAREAN SECTION     x 2     Social History   Socioeconomic History   Marital status: Married    Spouse name: Not on file   Number of children: Not on file   Years of education: Not on file   Highest education level: Not on file  Occupational History   Not on file  Tobacco Use   Smoking status: Never   Smokeless tobacco: Never  Vaping Use   Vaping Use: Never used  Substance and Sexual Activity   Alcohol use: Not Currently   Drug use: Never   Sexual activity: Not on file  Other Topics Concern   Not on file  Social History Narrative   Not on file   Social Determinants of Health   Financial Resource Strain: Not on file  Food Insecurity: Not on file  Transportation Needs: Not on file  Physical Activity: Not on file  Stress: Not on file  Social Connections: Not on file  Intimate Partner Violence: Not on file     Allergies  Allergen Reactions   Aspirin Swelling   Sulfa Antibiotics Swelling   Nystatin Hives    Cream only     CBC    Component Value Date/Time   WBC 10.3 09/16/2018 0404   RBC 4.02 09/16/2018 0404   HGB 10.7  (L) 09/16/2018 0404   HCT 37.2 09/16/2018 0404   PLT 285 09/16/2018 0404   MCV 92.5 09/16/2018 0404   MCH 26.6 09/16/2018 0404   MCHC 28.8 (L) 09/16/2018 0404   RDW 14.6 09/16/2018 0404   LYMPHSABS 1.3 09/14/2018 1418   MONOABS 0.6 09/14/2018 1418   EOSABS 0.3 09/14/2018 1418   BASOSABS 0.0 09/14/2018 1418    Pulmonary Functions Testing Results:     No data to display          Outpatient Medications Prior to Visit  Medication Sig Dispense Refill   albuterol (VENTOLIN HFA) 108 (90 Base) MCG/ACT inhaler 1 puff as needed Inhalation every 4 hrs     atorvastatin (LIPITOR) 20 MG tablet Take 20 mg by mouth daily.     cyclobenzaprine (FLEXERIL) 10 MG tablet Take 10 mg by mouth at bedtime as needed for muscle spasms.     fexofenadine (ALLEGRA) 180 MG tablet 1 tablet Swallow whole with water; do not take with fruit juices. Orally Once a day for 30 days     fluticasone (FLOVENT HFA) 220 MCG/ACT inhaler Inhale 1 puff into the lungs in the morning and at bedtime.     furosemide (LASIX) 40 MG tablet Take 40 mg by mouth daily.     ibuprofen (ADVIL) 800 MG tablet 1 tablet with food or milk as needed Orally every 8 hrs     ipratropium-albuterol (DUONEB) 0.5-2.5 (3) MG/3ML SOLN 3 mL as needed Inhalation every 6 hrs     levothyroxine (SYNTHROID) 50 MCG tablet Take 50 mcg by mouth daily before breakfast.     LEXAPRO 10 MG tablet Take 10 mg by mouth daily.     lisinopril (ZESTRIL) 10 MG tablet Take 10 mg by mouth daily.     NEXIUM 40 MG capsule Take 40 mg by mouth daily at 12 noon.     nystatin (MYCOSTATIN/NYSTOP) powder 1 application Externally every 8 hrs for 90 days     Vitamin D, Ergocalciferol, (DRISDOL) 1.25 MG (50000 UNIT) CAPS capsule Take 50,000 Units by mouth every 7 (seven) days.  albuterol (VENTOLIN HFA) 108 (90 Base) MCG/ACT inhaler Inhale into the lungs. (Patient not taking: Reported on 04/09/2022)     ibuprofen (ADVIL) 600 MG tablet Take by mouth. (Patient not taking: Reported on  04/09/2022)     No facility-administered medications prior to visit.

## 2022-05-22 ENCOUNTER — Telehealth: Payer: Self-pay | Admitting: Student in an Organized Health Care Education/Training Program

## 2022-05-22 DIAGNOSIS — E662 Morbid (severe) obesity with alveolar hypoventilation: Secondary | ICD-10-CM

## 2022-05-22 NOTE — Telephone Encounter (Signed)
We received note from [facility] with [facility] stating the patient has refused to schedule in lab sleep study

## 2022-05-22 NOTE — Telephone Encounter (Signed)
I spoke with the patient. I told her we could not do a Home Sleep Study because she is on a Vent and Oxygen. Her husband interrupted from the background saying it was not going to be done then. She agreed that she could not get to the sleep center to do the sleep study. I told her I would let Dr. Genia Harold know. Her husband again interrupted and said that "it is funny how all these other people have it done."   Dr. Genia Harold, is there anything else that can be done?

## 2022-05-22 NOTE — Telephone Encounter (Signed)
Kaitlyn Taylor states patient would like to do home sleep test. Kaitlyn Taylor phone number is [phone].

## 2022-05-27 NOTE — Telephone Encounter (Signed)
Kaitlyn Taylor, can you assist with this? Would HST work?

## 2022-05-27 NOTE — Telephone Encounter (Signed)
I have called and left Melissa with [employer] a message to call me back about this patient

## 2022-06-02 NOTE — Telephone Encounter (Signed)
Perfect, thank you

## 2022-06-02 NOTE — Telephone Encounter (Signed)
Kaitlyn Taylor from [facility] called me back with information. The patient can do home sleep test but she will not be able to use the vent on the night she does the sleep study. Kaitlyn Taylor will be sending me her resent compliance report along with the criteria for Obesity Hyperventilation syndrome.  Kaitlyn Taylor and I talked about if the home sleep test is ordered that we should send the order to the [facility] group that is helping with sleep studies in [place].

## 2022-06-03 NOTE — Telephone Encounter (Signed)
HST ordered.

## 2022-06-04 NOTE — Telephone Encounter (Signed)
Patient is aware of below message and voiced her understanding.  Nothing further needed.   

## 2022-06-17 ENCOUNTER — Telehealth: Payer: Self-pay | Admitting: Cardiology

## 2022-06-17 DIAGNOSIS — J9601 Acute respiratory failure with hypoxia: Secondary | ICD-10-CM

## 2022-06-17 DIAGNOSIS — I10 Essential (primary) hypertension: Secondary | ICD-10-CM

## 2022-06-17 NOTE — Telephone Encounter (Signed)
STAT if HR is under 50 or over 120 (normal HR is 60-100 beats per minute)  What is your heart rate?  No readings for today  Do you have a log of your heart rate readings (document readings)?  4/08: 138, 110  Do you have any other symptoms?  Patient's husband states yesterday when home health was there, they walked 16 steps into the kitchen to check patient's weight and her complexion turned pale, she started having chest pain, and her HR went up to 138. When they got the patient back to her chair, her HR dropped down to 110. Husband mentions that the patient gets CP with movement. Today the patient feels tired but she no longer has CP. Has not checked HR today.

## 2022-06-17 NOTE — Telephone Encounter (Signed)
Called back to review how could we help.   He simply wants appointment. I then offered to schedule appointment but he refused in person appt and became adamant that she is not able to get out of the house and that if she did it would kill her. He also stated that it was too costly for ambulance. He would like for her to have virtual visit with provider. Reviewed that it would be difficult to see her for elevated heart rates with no EKG. He states they have done EKG's on her through [facility] and could have them faxed over to [place] but just refuses for her to come in for appointment. He advised that he would not move her regardless the circumstances because moving her makes it much worse.   Checked with providers following CMA and she states that provider does not do any virtual visits.   Called back and advised that provider does not do any virtual visits and he verbalized understanding stating that EKG's sent may be helpful. Again encouraged ED.

## 2022-06-17 NOTE — Telephone Encounter (Signed)
Patients spouse called in to report patients heart rates have been elevated and chest discomfort with movement. He states that she is really tired and when she was getting on the scale recently she turned pale. He states that her blood pressures have been fine but no available readings.   He states that patient gets services through [facility] and the doctor and NP with that team wanted him to let us know.   Recommended that she go to ED for further evaluation which he denied then hung the phone up on me.

## 2022-06-18 NOTE — Telephone Encounter (Signed)
Pt's husband called requesting to switch from Dr. Azucena Cecil to Dr. Mariah Milling in order to have the same cardiologist.   Will froward to both MD for approval

## 2022-06-24 NOTE — Telephone Encounter (Signed)
I called to speak with Mr. Gruwell about a phone message we had come in on him and his wife also advised she was waiting to see if Dr. Mariah Milling would accept her as a patient (transfer from Dr. Azucena Cecil) as Dr. Mariah Milling also see her husband.  She reports she is home bound, but did have an episode of a-fib that was noted by her home health nurse last Monday.  She states in general that if she gets up with PT or moves around at all her HR will "jump up." She advised this occurs almost daily.  I advised the patient, that since she is home bound, we will see if Dr. Mariah Milling: 1) will accept her as a patient 2) order a 2 week ZIO monitor for her to assess her HR/ rhythm.  The patient voices understanding and is agreeable.

## 2022-07-07 NOTE — Telephone Encounter (Signed)
Spoke with spouse and informed him that an order for the ZIO heart monitor has been placed and instructions will be sent via MyChart. Spouse understood with read back.

## 2022-07-15 ENCOUNTER — Ambulatory Visit: Payer: Medicaid Other | Attending: Cardiovascular Disease

## 2022-07-15 ENCOUNTER — Other Ambulatory Visit: Payer: Self-pay

## 2022-07-15 DIAGNOSIS — R000 Tachycardia, unspecified: Secondary | ICD-10-CM

## 2022-07-17 DIAGNOSIS — R000 Tachycardia, unspecified: Secondary | ICD-10-CM | POA: Diagnosis not present

## 2022-09-05 ENCOUNTER — Telehealth: Payer: Self-pay | Admitting: Cardiovascular Disease

## 2022-09-05 NOTE — Telephone Encounter (Signed)
Patient's husband called and said they are both frustrated because it has been 3 or 4 weeks and no one has contacted them about their results  

## 2022-09-05 NOTE — Telephone Encounter (Signed)
The patient's husband and patient have been made aware that the results came in today. We will send a message to Dr. Mariah Milling so that he can review them.  It was explained to them that we get the results from the monitor company when they are released from them. They verbalized their understanding.

## 2022-09-08 NOTE — Telephone Encounter (Signed)
Results released in MyChart and viewed by patient.    Written by Antonieta Iba, MD on 09/07/2022 12:52 PM EDT Seen by patient Kaitlyn Taylor on 09/07/2022 12:53 PM

## 2023-01-22 ENCOUNTER — Telehealth: Payer: Medicaid Other | Admitting: Internal Medicine

## 2023-01-22 ENCOUNTER — Encounter: Payer: Self-pay | Admitting: Internal Medicine

## 2023-01-22 ENCOUNTER — Telehealth: Payer: Medicaid Other | Admitting: Student in an Organized Health Care Education/Training Program

## 2023-01-22 DIAGNOSIS — J9611 Chronic respiratory failure with hypoxia: Secondary | ICD-10-CM

## 2023-01-22 DIAGNOSIS — J9612 Chronic respiratory failure with hypercapnia: Secondary | ICD-10-CM | POA: Diagnosis not present

## 2023-01-22 DIAGNOSIS — J4540 Moderate persistent asthma, uncomplicated: Secondary | ICD-10-CM

## 2023-01-22 NOTE — Patient Instructions (Signed)
Continue current noninvasive ventilator at home Continue oxygen as prescribed Continue Lasix as tolerated Follow-up with cardiology  Avoid secondhand smoke Avoid SICK contacts Recommend  Masking  when appropriate Recommend Keep up-to-date with vaccinations

## 2023-01-22 NOTE — Progress Notes (Signed)
Subjective:   PATIENT ID: Kaitlyn Taylor GENDER: female DOB: Jul 28, 1965, MRN: [id number]    I connected with the patient by telephone enabled telemedicine visit and verified that I am speaking with the correct person using two identifiers.    I discussed the limitations, risks, security and privacy concerns of performing an evaluation and management service by telemedicine and the availability of in-person appointments. I also discussed with the patient that there may be a patient responsible charge related to this service. The patient expressed understanding and agreed to proceed.  PATIENT AGREES AND CONFIRMS -YES   Other persons participating in the visit and their role in the encounter: Patient, nursing Patient at Home Physician in office    SYNOPSIS Ms. Lush is a pleasant 57 year old female with a history of morbid obesity who is presenting for a tele health visit.initial visit on 01/22/2022. Patient is morbidly obese and is unable to present to clinic for a visit - she requested a tele-health visit instead. She was admitted to the hospital in 2020 where she had developed hypoxic and hypercapnic respiratory failure secondary to presumed obesity hypoventilation syndrome. She was discharged on a non-invasive ventilator and started nocturnal trilogy (AVAPS) with improvement. She has not had any complications with the Trelegy. She bleeds 3 liters of oxygen into the Trilogy  overnight and uses oxygen via nasal cannula during the day. She had one exacerbation of her respiratory symptoms 2 years ago prompting an ED visit but has otherwise been stable. She is house bound secondary to morbid obesity    CC Follow up assessment OHS and chronic hypercapnic resp failure   HPI No exacerbation at this time No evidence of heart failure at this time No evidence or signs of infection at this time No respiratory distress No fevers, chills, nausea, vomiting, diarrhea No evidence of  lower extremity edema No evidence hemoptysis   Patient requires the use of NIV both nightly and daytime to help with exacerbation periods. The use of NIV will treat patient's high PCO2 levels and can reduce the risk of exacerbations in future hospitalizations when used at night and during the day.  Patient will need these advanced settings in conjunction with his current medication regimen  Failure to have NIV available for use over 24hrs  Could lead to severe respiratory failure and cardiac arrest and death   Patient can maintain and clear their own airway.Patient is able to clear secretions and protect airway.   Failure to have NIV available for home use could lead to encephalopathy, cardiac arrest and death.   Current recommendation is for AVAPS     Indication for NIPPV* Last blood gas: None recent. Last serum HCO3 37 No components found for: ABG *General Indication: stable pCO2>45 or serum HCO3 >30 *Trilogy NIV Indications: (pCO2>51 or HCO3 >31) or (pCO2 48-51 with two COPD hospitalizations in last year)  Patient meets criteria for NIPPV    Review of Systems:  Gen:  Denies  fever, sweats, chills weight loss  HEENT: Denies blurred vision, double vision, ear pain, eye pain, hearing loss, nose bleeds, sore throat Cardiac:  No dizziness, chest pain or heaviness, chest tightness,edema, No JVD Resp:   No cough, -sputum production, +shortness of breath,-wheezing, -hemoptysis,  Gi: Denies swallowing difficulty, stomach pain, nausea or vomiting, diarrhea, constipation, bowel incontinence Gu:  Denies bladder incontinence, burning urine Ext:    swelling Skin: Denies  skin rash, easy bruising or bleeding or hives Endoc:  Denies polyuria, polydipsia , polyphagia or weight change Psych:   Denies depression, insomnia or hallucinations  Other:  All other systems negative   Objective:  Unable to obtain vitals or perform physical exam     Ancillary Information    Past Medical  History:  Diagnosis Date   Acute respiratory failure with hypoxia (HCC)    Back injury    Back pain    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    Carpal tunnel syndrome    CHF (congestive heart failure) (HCC)    Common migraine    COPD (chronic obstructive pulmonary disease) (HCC)    Decreased hearing    Depression    DM (diabetes mellitus), gestational, delivered    GERD (gastroesophageal reflux disease)    Heart murmur    Hyperlipidemia    Hypertension    Hypothyroid    Memory loss    Obesity    Osteoporosis    Sleep apnea    Vitamin D deficiency      Family History  Problem Relation Age of Onset   Breast cancer Mother    Breast cancer Maternal Grandmother    Breast cancer Paternal Grandmother    Heart attack Father    Heart attack Paternal Grandfather      Past Surgical History:  Procedure Laterality Date   ABDOMINAL HYSTERECTOMY  1995   APPENDECTOMY     CESAREAN SECTION     x 2     Social History   Socioeconomic History   Marital status: Married    Spouse name: Not on file   Number of children: Not on file   Years of education: Not on file   Highest education level: Not on file  Occupational History   Not on file  Tobacco Use   Smoking status: Never   Smokeless tobacco: Never  Vaping Use   Vaping status: Never Used  Substance and Sexual Activity   Alcohol use: Not Currently   Drug use: Never   Sexual activity: Not on file  Other Topics Concern   Not on file  Social History Narrative   Not on file   Social Determinants of Health   Financial Resource Strain: Not on file  Food Insecurity: Not on file  Transportation Needs: Not on file  Physical Activity: Not on file  Stress: Not on file  Social Connections: Not on file  Intimate Partner Violence: Not on file     Allergies  Allergen Reactions   Aspirin Swelling   Sulfa Antibiotics Swelling   Nystatin Hives    Cream only     CBC    Component Value Date/Time   WBC 10.3  09/16/2018 0404   RBC 4.02 09/16/2018 0404   HGB 10.7 (L) 09/16/2018 0404   HCT 37.2 09/16/2018 0404   PLT 285 09/16/2018 0404   MCV 92.5 09/16/2018 0404   MCH 26.6 09/16/2018 0404   MCHC 28.8 (L) 09/16/2018 0404   RDW 14.6 09/16/2018 0404   LYMPHSABS 1.3 09/14/2018 1418   MONOABS 0.6 09/14/2018 1418   EOSABS 0.3 09/14/2018 1418   BASOSABS 0.0 09/14/2018 1418    Pulmonary Functions Testing Results:     No data to display          Outpatient Medications Prior to Visit  Medication Sig Dispense Refill   amoxicillin-clavulanate (AUGMENTIN) 875-125 MG tablet Take 1 tablet by mouth 2 (two) times daily.     albuterol (VENTOLIN HFA) 108 (90 Base) MCG/ACT inhaler  Inhale into the lungs. (Patient not taking: Reported on 04/09/2022)     albuterol (VENTOLIN HFA) 108 (90 Base) MCG/ACT inhaler 1 puff as needed Inhalation every 4 hrs     atorvastatin (LIPITOR) 20 MG tablet Take 20 mg by mouth daily.     cyclobenzaprine (FLEXERIL) 10 MG tablet Take 10 mg by mouth at bedtime as needed for muscle spasms.     fexofenadine (ALLEGRA) 180 MG tablet 1 tablet Swallow whole with water; do not take with fruit juices. Orally Once a day for 30 days     fluticasone (FLOVENT HFA) 220 MCG/ACT inhaler Inhale 1 puff into the lungs in the morning and at bedtime.     furosemide (LASIX) 40 MG tablet Take 40 mg by mouth daily.     ibuprofen (ADVIL) 600 MG tablet Take by mouth. (Patient not taking: Reported on 04/09/2022)     ibuprofen (ADVIL) 800 MG tablet 1 tablet with food or milk as needed Orally every 8 hrs     ipratropium-albuterol (DUONEB) 0.5-2.5 (3) MG/3ML SOLN 3 mL as needed Inhalation every 6 hrs     levothyroxine (SYNTHROID) 50 MCG tablet Take 50 mcg by mouth daily before breakfast.     LEXAPRO 10 MG tablet Take 10 mg by mouth daily.     lisinopril (ZESTRIL) 10 MG tablet Take 10 mg by mouth daily.     NEXIUM 40 MG capsule Take 40 mg by mouth daily at 12 noon.     nystatin (MYCOSTATIN/NYSTOP) powder 1  application Externally every 8 hrs for 90 days     Vitamin D, Ergocalciferol, (DRISDOL) 1.25 MG (50000 UNIT) CAPS capsule Take 50,000 Units by mouth every 7 (seven) days.     No facility-administered medications prior to visit.      Assessment & Plan:   57 year old pleasant white female with morbid obesity with chronic hypercapnic respiratory failure with severe OHS and severe respiratory insufficiency requiring noninvasive ventilation therapy in the setting of heart failure preserved EF, patient also with chronic hypoxic respiratory failure  Obesity hypoventilation syndrome Patient benefits from noninvasive ventilation Patient needs this for survival Current AVAPS Tidal volume 430 EPAP 8 Compliance is greater than 100% Should AVAPS be discontinued, she would quickly develop hypercapnic respiratory failure and require hospitalization.   Chronic hypoxic respiratory failure Continue oxygen as prescribed Patient needs this for survival  Morbidly obese with reactive airways disease asthma Patient currently on nebulized therapy as well as inhaled corticosteroids No exacerbation at this time No need for antibiotics or prednisone  Heart failure with preserved EF with diastolic dysfunction Follow-up with cardiology as scheduled Continue Lasix as prescribed   MEDICATION ADJUSTMENTS/LABS AND TESTS ORDERED: Continue current noninvasive ventilator at home Continue oxygen as prescribed Continue Lasix as tolerated Follow-up with cardiology Avoid secondhand smoke Avoid SICK contacts Recommend  Masking  when appropriate Recommend Keep up-to-date with vaccinations   CURRENT MEDICATIONS REVIEWED AT LENGTH WITH PATIENT TODAY   Patient  satisfied with Plan of action and management. All questions answered  Follow up 6 months  Total Time Spent 35 mins   Wallis Bamberg Santiago Glad, M.D.  [facility]  Medical Director [facility] Medical Director [facility]  Cardio-Pulmonary Department

## 2023-10-27 ENCOUNTER — Encounter: Payer: Self-pay | Admitting: Internal Medicine

## 2023-10-27 DIAGNOSIS — E662 Morbid (severe) obesity with alveolar hypoventilation: Secondary | ICD-10-CM

## 2023-10-27 DIAGNOSIS — J9611 Chronic respiratory failure with hypoxia: Secondary | ICD-10-CM

## 2023-10-29 NOTE — Telephone Encounter (Signed)
 Form has been faxed to [facility] once [employer] approves they will call to get correct mailing address to be mailed to the patient

## 2023-11-25 ENCOUNTER — Encounter: Payer: Self-pay | Admitting: Internal Medicine

## 2023-11-26 ENCOUNTER — Other Ambulatory Visit: Payer: Self-pay | Admitting: Internal Medicine

## 2023-11-26 DIAGNOSIS — G4733 Obstructive sleep apnea (adult) (pediatric): Secondary | ICD-10-CM

## 2023-11-26 NOTE — Telephone Encounter (Signed)
 Dr. Isaiah is seeking higher level advice:  Pt is on Trilogy Vent and needs this for survival, she is physically bed-bound and is unable to come in for a in-person visit. Last visit with Dr. Isaiah was in 2024 or early 2025 as a virtual visit. We had received a fax about her needing a re-certification for the continued supplying of her Trilogy or she is at risk of losing the machine.   We would need to do another sleep study; had offered to order a home sleep study per Dr. Isaiah but the patient was concerned about mortal harm if she were to perform this test without her vent.   Then Dr. Isaiah has recommended an in-lab sleep study, but due to the patient being bed-bound, she is unable to be transported without professional assistance to the lab, and as verbally discussed with Dr. Isaiah we are unsure as to how that could be orchestrated.   We are now at an impasse since Dr. Isaiah has no further recommendations on how to solve this issue or get further testing for this patient.   What would be advised in this situation?  Thanks.

## 2023-11-27 NOTE — Telephone Encounter (Signed)
 I have emailed all documents to [place] with [employer] to see what can be done. If the patient needs Professional help getting from home to sleep lab and back. The option is [place] but it will cost

## 2023-11-30 NOTE — Telephone Encounter (Signed)
 I have received a response from [place] with [employer] [facility] [place]! I received a response from my vent specialist. She said that we can use a virtual note as long as the reason for it being virtual is noted. I will state what insurance will need below.  An ABG with a PaCO2 of 52 or higher while on their prescribed liter flow within the last two years. A new progress note with a rule out/risk of harm statement. (If virtually done, please note why it was virtually done in the progress note) A new signed order which I can type up and provide when we have a qualifying ABG and updated progress note.

## 2023-12-01 ENCOUNTER — Other Ambulatory Visit: Payer: Self-pay | Admitting: Internal Medicine

## 2023-12-08 NOTE — Telephone Encounter (Signed)
 I have sent urgent email to [facility] with [employer] waiting his response

## 2023-12-09 NOTE — Telephone Encounter (Signed)
 I have scheduled that appt. We will see you then.  Evalene, CMA

## 2023-12-09 NOTE — Telephone Encounter (Signed)
 I have spoke with Kaitlyn Taylor with [employer] he spoke with his RT at [employer] and they suggested doing virtual visit and make notation why ABG can't be done. Patient is home bound/ bed bound

## 2023-12-22 ENCOUNTER — Telehealth (INDEPENDENT_AMBULATORY_CARE_PROVIDER_SITE_OTHER): Admitting: Internal Medicine

## 2023-12-22 DIAGNOSIS — J9612 Chronic respiratory failure with hypercapnia: Secondary | ICD-10-CM | POA: Diagnosis not present

## 2023-12-22 DIAGNOSIS — J9611 Chronic respiratory failure with hypoxia: Secondary | ICD-10-CM | POA: Diagnosis not present

## 2023-12-22 DIAGNOSIS — G4733 Obstructive sleep apnea (adult) (pediatric): Secondary | ICD-10-CM

## 2023-12-22 DIAGNOSIS — J449 Chronic obstructive pulmonary disease, unspecified: Secondary | ICD-10-CM

## 2023-12-22 DIAGNOSIS — R0602 Shortness of breath: Secondary | ICD-10-CM

## 2023-12-22 DIAGNOSIS — E662 Morbid (severe) obesity with alveolar hypoventilation: Secondary | ICD-10-CM

## 2023-12-22 MED ORDER — BUDESONIDE 0.5 MG/2ML IN SUSP: 0.5000 mg | Freq: Two times a day (BID) | RESPIRATORY_TRACT | 10 refills | Status: AC

## 2023-12-22 NOTE — Progress Notes (Addendum)
 Subjective:   PATIENT ID: Kaitlyn Taylor GENDER: female DOB: 1965/04/27, MRN: [id number]    I connected with the patient by telephone enabled telemedicine visit and verified that I am speaking with the correct person using two identifiers.    I discussed the limitations, risks, security and privacy concerns of performing an evaluation and management service by telemedicine and the availability of in-person appointments. I also discussed with the patient that there may be a patient responsible charge related to this service. The patient expressed understanding and agreed to proceed.  PATIENT AGREES AND CONFIRMS -YES   Other persons participating in the visit and their role in the encounter: Patient, nursing Patient at Home Physician in office  Patient with morbid obesity and unable to leave the home Patient is bedbound and unable to leave her home setting As a result AVG is not possible  Patient does not have means of traveling This is very important in terms of therapy as she needs her trilogy AVAPS ventilator to support her breathing   Due to the severity of patient's chronic hypercapnic respiratory failure due to her COPD the patient requires volume targeted pressure support from home mechanical ventilation BiPAP BiPAP ST and ST/STA have been ruled out RAD with or without backup rate does not supply volume targeted ventilation to manage patient's hypercapnia.  OSA is not the predominant cause of her hypercapnia and home mechanical ventilation is not being ordered to treat OSA the patient requires ventilatory support for more than 8 hours in a 24-hour period to normalize the patient's PaCO2 and prevent readmissions to the hospital failure to adequately ventilate will result in serious harm and/or death to the patient the patient also is able to clear her secretions and protect her airway    SYNOPSIS Kaitlyn Taylor is a pleasant 58 year old female with a history of morbid  obesity who is presenting for a tele health visit.  Patient is morbidly obese and is unable to present to clinic for a visit - she requested a tele-health visit instead. She was admitted to the hospital in 2020 where she had developed hypoxic and hypercapnic respiratory failure secondary to presumed obesity hypoventilation syndrome.   She was discharged on a non-invasive ventilator and started nocturnal trilogy (AVAPS) with improvement. She has not had any complications with the Trelegy. She bleeds 3 liters of oxygen into the Trilogy  overnight and uses oxygen via nasal cannula during the day. She had one exacerbation of her respiratory symptoms 2 years ago prompting an ED visit but has otherwise been stable. She is house bound secondary to morbid obesity    CC Follow up assessment of OHS Chronic hypercapnic resp failure   HPI No exacerbation at this time No evidence of heart failure at this time No evidence or signs of infection at this time No respiratory distress No fevers, chills, nausea, vomiting, diarrhea No evidence of lower extremity edema No evidence hemoptysis   Patient requires the use of NIV both nightly and daytime to help with exacerbation periods. The use of NIV will treat patient's high PCO2 levels and can reduce the risk of exacerbations in future hospitalizations when used at night and during the day.  Patient will need these advanced settings in conjunction with his current medication regimen  Failure to have NIV available for use over 24hrs  Could lead to severe respiratory failure and cardiac arrest and death   Patient can maintain and clear their own airway.Patient  is able to clear secretions and protect airway.   Failure to have NIV available for home use could lead to encephalopathy, cardiac arrest and death.   Current recommendation is for [employer] and DME company needs to be notified of her situation so that she may get the supplies and  machines she needs for survival    Indication for NIPPV* Last blood gas: None recent. Last serum HCO3 37 No components found for: ABG *General Indication: stable pCO2>45 or serum HCO3 >30 *Trilogy NIV Indications: (pCO2>51 or HCO3 >31) or (pCO2 48-51 with two COPD hospitalizations in last year)  Patient meets criteria for NIPPV   Patient unable to tolerate Flovent at this time unable to take deep breaths therefore will prescribe Pulmicort nebulizers    Review of Systems: Gen:  Denies  fever, sweats, chills weight loss  HEENT: Denies blurred vision, double vision, ear pain, eye pain, hearing loss, nose bleeds, sore throat Cardiac:  No dizziness, chest pain or heaviness, chest tightness,edema, No JVD Resp:   No cough, -sputum production, +shortness of breath,-wheezing, -hemoptysis,  Other:  All other systems negative     Objective:  Unable to obtain vitals or perform physical exam     Ancillary Information    Past Medical History:  Diagnosis Date   Acute respiratory failure with hypoxia (HCC)    Back injury    Back pain    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    Carpal tunnel syndrome    CHF (congestive heart failure) (HCC)    Common migraine    COPD (chronic obstructive pulmonary disease) (HCC)    Decreased hearing    Depression    DM (diabetes mellitus), gestational, delivered    GERD (gastroesophageal reflux disease)    Heart murmur    Hyperlipidemia    Hypertension    Hypothyroid    Memory loss    Obesity    Osteoporosis    Sleep apnea    Vitamin D deficiency      Family History  Problem Relation Age of Onset   Breast cancer Mother    Breast cancer Maternal Grandmother    Breast cancer Paternal Grandmother    Heart attack Father    Heart attack Paternal Grandfather      Past Surgical History:  Procedure Laterality Date   ABDOMINAL HYSTERECTOMY  1995   APPENDECTOMY     CESAREAN SECTION     x 2     Social History   Socioeconomic  History   Marital status: Married    Spouse name: Not on file   Number of children: Not on file   Years of education: Not on file   Highest education level: Not on file  Occupational History   Not on file  Tobacco Use   Smoking status: Never   Smokeless tobacco: Never  Vaping Use   Vaping status: Never Used  Substance and Sexual Activity   Alcohol use: Not Currently   Drug use: Never   Sexual activity: Not on file  Other Topics Concern   Not on file  Social History Narrative   Not on file   Social Drivers of Health   Financial Resource Strain: Not on file  Food Insecurity: Not on file  Transportation Needs: Not on file  Physical Activity: Not on file  Stress: Not on file  Social Connections: Not on file  Intimate Partner Violence: Not on file     Allergies  Allergen Reactions  Aspirin Swelling   Sulfa Antibiotics Swelling   Nystatin Hives    Cream only     CBC    Component Value Date/Time   WBC 10.3 09/16/2018 0404   RBC 4.02 09/16/2018 0404   HGB 10.7 (L) 09/16/2018 0404   HCT 37.2 09/16/2018 0404   PLT 285 09/16/2018 0404   MCV 92.5 09/16/2018 0404   MCH 26.6 09/16/2018 0404   MCHC 28.8 (L) 09/16/2018 0404   RDW 14.6 09/16/2018 0404   LYMPHSABS 1.3 09/14/2018 1418   MONOABS 0.6 09/14/2018 1418   EOSABS 0.3 09/14/2018 1418   BASOSABS 0.0 09/14/2018 1418    Pulmonary Functions Testing Results:     No data to display          Outpatient Medications Prior to Visit  Medication Sig Dispense Refill   albuterol  (VENTOLIN  HFA) 108 (90 Base) MCG/ACT inhaler Inhale into the lungs. (Patient not taking: Reported on 04/09/2022)     albuterol  (VENTOLIN  HFA) 108 (90 Base) MCG/ACT inhaler 1 puff as needed Inhalation every 4 hrs     atorvastatin (LIPITOR) 20 MG tablet Take 20 mg by mouth daily.     cyclobenzaprine (FLEXERIL) 10 MG tablet Take 10 mg by mouth at bedtime as needed for muscle spasms.     fexofenadine (ALLEGRA) 180 MG tablet 1 tablet Swallow whole  with water; do not take with fruit juices. Orally Once a day for 30 days     fluticasone (FLOVENT HFA) 220 MCG/ACT inhaler Inhale 1 puff into the lungs in the morning and at bedtime.     furosemide (LASIX) 40 MG tablet Take 40 mg by mouth daily.     ibuprofen (ADVIL) 600 MG tablet Take by mouth. (Patient not taking: Reported on 04/09/2022)     ibuprofen (ADVIL) 800 MG tablet 1 tablet with food or milk as needed Orally every 8 hrs     ipratropium-albuterol  (DUONEB) 0.5-2.5 (3) MG/3ML SOLN 3 mL as needed Inhalation every 6 hrs     levothyroxine (SYNTHROID) 50 MCG tablet Take 50 mcg by mouth daily before breakfast.     LEXAPRO 10 MG tablet Take 10 mg by mouth daily.     lisinopril (ZESTRIL) 10 MG tablet Take 10 mg by mouth daily.     NEXIUM 40 MG capsule Take 40 mg by mouth daily at 12 noon.     nystatin (MYCOSTATIN/NYSTOP) powder 1 application Externally every 8 hrs for 90 days     Vitamin D, Ergocalciferol, (DRISDOL) 1.25 MG (50000 UNIT) CAPS capsule Take 50,000 Units by mouth every 7 (seven) days.     No facility-administered medications prior to visit.      Assessment & Plan:   58 year old pleasant white female with morbid obesity with chronic hypercapnic respiratory failure with severe OHS and severe respiratory insufficiency requiring noninvasive ventilation therapy in the setting of heart failure preserved EF, patient also with chronic hypoxic respiratory failure   Obesity hypoventilation syndrome Patient benefits from noninvasive ventilation-she needs this for survival This will prevent recurrent hospitalizations and death Current AVAPS Tidal volume 430 EPAP 8 Compliance is greater than 100% Should AVAPS be discontinued, she would quickly develop hypercapnic respiratory failure and require hospitalization.   Chronic Hypoxic resp failure due to COPD -Patient benefits from oxygen therapy 2L [place]  -recommend using oxygen as prescribed -patient needs this for survival    Morbidly  obese with reactive airways disease asthma Patient currently on nebulized therapy  Patient no longer can take traditional inhaler therapy therefore  will prescribe Pulmicort nebulizers No exacerbation at this time No need for antibiotics or prednisone  Heart failure with preserved EF with diastolic dysfunction Follow-up with cardiology as scheduled Continue Lasix as prescribed   MEDICATION ADJUSTMENTS/LABS AND TESTS ORDERED: Continue current noninvasive ventilator at home Continue oxygen as prescribed Continue Lasix as tolerated Follow-up with cardiology Avoid Allergens and Irritants Avoid secondhand smoke Avoid SICK contacts Recommend  Masking  when appropriate Recommend Keep up-to-date with vaccinations  CURRENT MEDICATIONS REVIEWED AT LENGTH WITH PATIENT TODAY   Patient  satisfied with Plan of action and management. All questions answered   Follow up 6 months   I spent a total of 42 minutes dedicated to the care of this patient on the date of this encounter to include pre-visit review of records, face-to-face time with the patient discussing conditions above, post visit ordering of testing, clinical documentation with the electronic health record, making appropriate referrals as documented, and communicating necessary information to the patient's healthcare team.    The Patient requires high complexity decision making for assessment and support, frequent evaluation and titration of therapies, application of advanced monitoring technologies and extensive interpretation of multiple databases.  Patient satisfied with Plan of action and management. All questions answered    Nickolas Alm Cellar, M.D.  [facility]  Medical Director [facility]

## 2023-12-29 ENCOUNTER — Telehealth: Payer: Self-pay | Admitting: Internal Medicine

## 2023-12-29 NOTE — Telephone Encounter (Signed)
 Dr. Isaiah placed an order for in lab sleep study on 11/26/23. We received a note from [facility] the patient has not returned their calls to schedule sleep study. They will not make further attempts to schedule this patient
